# Patient Record
Sex: Female | Born: 1965 | Race: White | Hispanic: No | Smoking: Never smoker
Health system: Southern US, Community
[De-identification: ages and names within clinical notes are randomized; demographics above are authoritative.]

## PROBLEM LIST (undated history)

## (undated) DIAGNOSIS — R7303 Prediabetes: Secondary | ICD-10-CM

## (undated) DIAGNOSIS — M1990 Unspecified osteoarthritis, unspecified site: Secondary | ICD-10-CM

## (undated) DIAGNOSIS — I10 Essential (primary) hypertension: Secondary | ICD-10-CM

## (undated) HISTORY — PX: KNEE ARTHROSCOPY: SUR90

## (undated) HISTORY — PX: TONSILLECTOMY: SUR1361

## (undated) HISTORY — PX: CHOLECYSTECTOMY: SHX55

## (undated) HISTORY — PX: ABDOMINAL HYSTERECTOMY: SHX81

---

## 1998-05-29 ENCOUNTER — Emergency Department (HOSPITAL_COMMUNITY): Admission: EM | Admit: 1998-05-29 | Discharge: 1998-05-29 | Payer: Self-pay | Admitting: Emergency Medicine

## 1998-11-09 ENCOUNTER — Other Ambulatory Visit: Admission: RE | Admit: 1998-11-09 | Discharge: 1998-11-09 | Payer: Self-pay | Admitting: Obstetrics and Gynecology

## 1999-11-23 ENCOUNTER — Other Ambulatory Visit: Admission: RE | Admit: 1999-11-23 | Discharge: 1999-11-23 | Payer: Self-pay | Admitting: Obstetrics and Gynecology

## 1999-11-24 ENCOUNTER — Encounter: Payer: Self-pay | Admitting: Obstetrics and Gynecology

## 1999-11-24 ENCOUNTER — Encounter: Admission: RE | Admit: 1999-11-24 | Discharge: 1999-11-24 | Payer: Self-pay | Admitting: Obstetrics and Gynecology

## 2000-05-18 ENCOUNTER — Other Ambulatory Visit: Admission: RE | Admit: 2000-05-18 | Discharge: 2000-05-18 | Payer: Self-pay | Admitting: Obstetrics and Gynecology

## 2000-07-10 ENCOUNTER — Other Ambulatory Visit: Admission: RE | Admit: 2000-07-10 | Discharge: 2000-07-10 | Payer: Self-pay | Admitting: Obstetrics and Gynecology

## 2000-07-10 ENCOUNTER — Encounter (INDEPENDENT_AMBULATORY_CARE_PROVIDER_SITE_OTHER): Payer: Self-pay | Admitting: Specialist

## 2000-12-03 ENCOUNTER — Other Ambulatory Visit: Admission: RE | Admit: 2000-12-03 | Discharge: 2000-12-03 | Payer: Self-pay | Admitting: Obstetrics and Gynecology

## 2001-02-25 ENCOUNTER — Other Ambulatory Visit: Admission: RE | Admit: 2001-02-25 | Discharge: 2001-02-25 | Payer: Self-pay | Admitting: Obstetrics and Gynecology

## 2001-09-09 ENCOUNTER — Other Ambulatory Visit: Admission: RE | Admit: 2001-09-09 | Discharge: 2001-09-09 | Payer: Self-pay | Admitting: Obstetrics and Gynecology

## 2005-03-17 ENCOUNTER — Emergency Department (HOSPITAL_COMMUNITY): Admission: EM | Admit: 2005-03-17 | Discharge: 2005-03-17 | Payer: Self-pay | Admitting: Family Medicine

## 2006-01-31 ENCOUNTER — Other Ambulatory Visit: Admission: RE | Admit: 2006-01-31 | Discharge: 2006-01-31 | Payer: Self-pay | Admitting: Obstetrics and Gynecology

## 2006-04-07 ENCOUNTER — Emergency Department (HOSPITAL_COMMUNITY): Admission: EM | Admit: 2006-04-07 | Discharge: 2006-04-07 | Payer: Self-pay | Admitting: Family Medicine

## 2006-05-01 ENCOUNTER — Ambulatory Visit (HOSPITAL_COMMUNITY): Admission: RE | Admit: 2006-05-01 | Discharge: 2006-05-01 | Payer: Self-pay | Admitting: Obstetrics and Gynecology

## 2006-05-15 ENCOUNTER — Encounter (INDEPENDENT_AMBULATORY_CARE_PROVIDER_SITE_OTHER): Payer: Self-pay | Admitting: Specialist

## 2006-05-16 ENCOUNTER — Encounter (INDEPENDENT_AMBULATORY_CARE_PROVIDER_SITE_OTHER): Payer: Self-pay | Admitting: Specialist

## 2006-05-16 ENCOUNTER — Inpatient Hospital Stay (HOSPITAL_COMMUNITY): Admission: RE | Admit: 2006-05-16 | Discharge: 2006-05-18 | Payer: Self-pay | Admitting: Obstetrics and Gynecology

## 2010-11-24 ENCOUNTER — Emergency Department (HOSPITAL_COMMUNITY)
Admission: EM | Admit: 2010-11-24 | Discharge: 2010-11-24 | Payer: Self-pay | Source: Home / Self Care | Admitting: Emergency Medicine

## 2011-03-06 ENCOUNTER — Emergency Department (HOSPITAL_COMMUNITY)
Admission: EM | Admit: 2011-03-06 | Discharge: 2011-03-06 | Disposition: A | Discharge status: Home / Self Care | Payer: Managed Care, Other (non HMO) | Attending: Emergency Medicine | Admitting: Emergency Medicine

## 2011-03-06 DIAGNOSIS — L2989 Other pruritus: Secondary | ICD-10-CM | POA: Insufficient documentation

## 2011-03-06 DIAGNOSIS — L298 Other pruritus: Secondary | ICD-10-CM | POA: Insufficient documentation

## 2011-03-06 DIAGNOSIS — R21 Rash and other nonspecific skin eruption: Secondary | ICD-10-CM | POA: Insufficient documentation

## 2011-03-06 DIAGNOSIS — I10 Essential (primary) hypertension: Secondary | ICD-10-CM | POA: Insufficient documentation

## 2011-03-06 DIAGNOSIS — Z79899 Other long term (current) drug therapy: Secondary | ICD-10-CM | POA: Insufficient documentation

## 2011-05-12 NOTE — Discharge Summary (Signed)
NAMETABATHIA, KNOCHE                  ACCOUNT NO.:  [id number]   MEDICAL RECORD NO.:  [id number]          PATIENT TYPE:  INP   LOCATION:  9311                          FACILITY:  [facility]   PHYSICIAN:  Carrington Clamp, M.D. DATE OF BIRTH:  November 17, 1966   DATE OF ADMISSION:  05/16/2006  DATE OF DISCHARGE:  05/18/2006                                 DISCHARGE SUMMARY   ADMISSION DIAGNOSIS:  Symptomatic fibroid uterus.   DISCHARGE DIAGNOSIS:  Symptomatic fibroid uterus.   PROCEDURE:  Total abdominal hysterectomy.   LABORATORY DATA:  Postoperative hemoglobin and hematocrit of 10.2 and 30.4.   HISTORY OF PRESENT ILLNESS:  Please refer to dictated history and physical  in chart but briefly, this is a 45 year old G1, P1-0-0-1, who complained of  increasing pain and bleeding when stopping birth control pills and desiring  to have definitive therapy. She also is being treated for high blood  pressure and desired to stop birth control pills because of the risks of  heart attack and stroke with increasing age.   HOSPITAL COURSE:  The patient was admitted on May 16, 2006 for the above  named procedures and underwent them without complications. The patient had a  JP drain placed in the subcutaneous portion of the incision because of  thickness of that area. On postoperative day 1, the patient was eating and  ambulating and the JP drain was still putting out a significant amount of  fluid. On postoperative day 2, JP drain only put out 10 cc over 6 hours and  the patient was eating, ambulating, voiding, and tolerating her pain  medications. She was discharged after the JP drain was removed with the  following.   DIET:  Was increased fiber and water.   ACTIVITY:  Walk with assistance. No driving for 4 weeks. No lifting for 6  weeks. No sexual activity for 6 weeks.   WOUND CARE:  The patient may shower but no bath. Keep incision clean and  dry.   DISCHARGE MEDICATIONS:  1.  Percocet 5 mg 1 p.o. q.4  to 6 hours p.r.n. pain.  2.  Over-the-counter Motrin.   FOLLOWUP:  With Dr. Henderson Cloud the following Wednesday for staples out.   SPECIAL INSTRUCTIONS:  Cover JP drain site with gauze or Band-Aid and change  daily. Call if temperature greater than 100.4 or excessive bleeding or  drainage from incision.      Carrington Clamp, M.D.  Electronically Signed     MH/MEDQ  D:  06/28/2006  T:  06/28/2006  Job:  [id number]

## 2011-05-12 NOTE — Op Note (Signed)
Donna [name], [name]                  ACCOUNT NO.:  [id number]   MEDICAL RECORD NO.:  [id number]          PATIENT TYPE:  INP   LOCATION:  9311                          FACILITY:  [facility]   PHYSICIAN:  Carrington Clamp, M.D. DATE OF BIRTH:  05/16/1966   DATE OF PROCEDURE:  05/16/2006  DATE OF DISCHARGE:                                 OPERATIVE REPORT   PREOPERATIVE DIAGNOSIS:  Symptomatic fibroid uterus.   POSTOPERATIVE DIAGNOSIS:  Symptomatic fibroid uterus.   PROCEDURE:  Total abdominal hysterectomy.   SURGEON:  Carrington Clamp, M.D.   ASSISTANT:  Randye Lobo, M.D.   ANESTHESIA:  General.   SPECIMENS:  Uterus and cervix.  Fundal fibroid was amputated and sent for  frozen section and found to be a degenerating fibroid.   ESTIMATED BLOOD LOSS:  200 mL.   IV FLUIDS:  2200 mL.   URINE OUTPUT:  500 mL.   COMPLICATIONS:  None.   FINDINGS:  Nine weeks' size uterus with fibroid on the fundus adhered to the  omentum.  There normal ovaries and tubes bilaterally.  There are no other  abnormalities of the pelvis.  The ureters were palpated bilaterally.   MEDICATIONS:  Ancef and Gelfoam.   COUNTS:  Correct x3.   DRAINS:  J-P drain x1.   TECHNIQUE:  After adequate general anesthesia was achieved, the patient was  prepped, draped in usual sterile fashion in supine position.  A vertical  skin incision was made with scalpel 2 cm below the umbilicus through the  skin.  The subcutaneous layer was then first with Bovie cautery until fascia  could be seen.  Fascia was incised in the superior inferior manner with the  scalpel and then incised in the superior inferior manner with the Mayo  scissors.  The fascia was reflected from the rectus muscle. The rectus  muscles were split in the midline.  A bowel free portion of peritoneum was  entered into with sharp and blunt dissection and the peritoneum was incised  in superior to inferior manner with the Metzenbaum scissors.   The fibroid,  which was adhered to the omentum, was identified, immediately  brought through the incision.  Sharp dissection with the Metzenbaums was  used to remove the omentum off the fibroid.  There were several pieces of  omentum then that had essentially buttonholes in them.  These pieces were  then removed with Kelly's, excised with the Bovie cautery and then tied with  a free stitch of 2-0 Vicryl.  The omentum was found to be hemostatic and  omental pieces were taken off the field.  Attention was then turned to the  abdomen which was packed away with five wet laps and O'Connor-O'Sullivan  retractor.  The uterus was grasped bilaterally with a pair of Kelly clamps.  Each of the round ligaments were secured with a suture transfixion stitch of  0 Vicryl and then divided with Bovie cautery.  The Bovie cautery was then  used to incise the posterior leaf of the peritoneum and the anterior leaf of  the broad ligament along the line  of the reflection of the vesicouterine  fascia.  The bladder flap was then created with sharp and blunt dissection  and posterior leaves of the broad ligaments were entered into with the Bovie  cautery and Heaney clamps were placed over the uterine ovarian ligaments.  Each pedicle was secured with a freehand of 0 Vicryl followed by a stitch of  0 Vicryl.  The uterine arteries were skeletonized and then secured with  bilateral Heaney clamps at the level of the internal os.  Each pedicle was  incised Mayo scissors and secured with a stitch of 0 Vicryl.   The cardinal ligament was carefully dissected with alternating successive  bites of the straight Heaney clamp.  Each pedicle was incised with the  scalpel and secured with a stitch of 0 Vicryl.  Continued dissection of the  bladder flap off the cervix was performed and the uterus was amputated at  this point off the cervix because cervix was so long and the pelvis was  deep.  Long instruments were then used to continue the  dissection until it  was noted that the vagina had been entered into.  The Jorgenson clamps then  used to amputate the cervical specimen.  Kochers were used to secure the  vagina and two small portions of the cervix had been left in the vagina were  then excised with the Jorgenson scissors.  Angles of the cuff were then  secured with a figure-of-eight stitch of 0 Vicryl.  The cuff was closed with  a single figure-of-eight stitch of 0 Vicryl.  Irrigation was performed and  the pedicles were found to be hemostatic.  There is a small amount of oozing  of the bladder flap which was care of with the Bovie cautery and a piece of  Gelfoam.  Once it was indicated that hemostasis was achieved, all  instruments then were withdrawn from the abdomen.  The ureters were unable  to be seen but they were palpated well out of the field of dissection  bilaterally by both myself and Dr. Edward Jolly.  All instruments then withdrawn  from the abdomen and the peritoneum is closed with running stitch of 2-0  Vicryl.  The stitch incorporated as a second layer the rectus muscles.  The  fascia was closed with running stitch of 0 looped PDS.  The subcutaneous  tissue was rendered hemostatic with the Bovie cautery.  A JP drain was  placed in the subcutaneous tissue and pulled through the left-hand side of  the patient's abdomen and through a separate incision.  The subcutaneous  tissue was then closed with interrupted stitches of 2-0 plain gut.  The skin  was closed with staples.  The patient tolerated the procedure well and  returned to recovery in stable condition.      Carrington Clamp, M.D.  Electronically Signed     MH/MEDQ  D:  05/16/2006  T:  05/17/2006  Job:  [id number]

## 2011-05-12 NOTE — H&P (Signed)
NAMECHANTEE, CERINO                  ACCOUNT NO.:  [id number]   MEDICAL RECORD NO.:  [id number]          PATIENT TYPE:  INP   LOCATION:  NA                            FACILITY:  [facility]   PHYSICIAN:  Carrington Clamp, M.D. DATE OF BIRTH:  1966/02/13   DATE OF ADMISSION:  05/16/2006  DATE OF DISCHARGE:                                HISTORY & PHYSICAL   This is a 45 year old G1, P1-0-0-1 who complained of increased pain and  bleeding when stopping the birth control pills.  She had been on Coriva but  stopped it secondary to the fact that she is being treated for high blood  pressure.  The patient was seen and evaluated and found to have what  appeared to be about 7-week size uterus and was changed to Yaz continuous.  Patient came back in and discussed with me that she desired to have  definitive therapy as she had high blood pressure and was on  hydrochlorothiazide.  The patient had an ultrasound on April 10, 2006, that  showed a 3.61 anterior fibroid that appeared to be smaller than the one that  had been recorded in 2000.  The uterus was measured 8 x 3 x 4.  Left ovary  appeared normal.  Right ovary was not seen.  The endometrial thickness was  0.4 cm.  I discussed with the patient the risks, benefits, and alternatives  of different therapies including NovaSure endometrial ablation with D&C,  hysteroscopy and tubal ligation to ensure that patient could not get  pregnant in the future.  The patient chose that option and was taken to  surgery on May 01, 2006.   On May 01, 2006, patient was taken for a diagnostic laparoscopy and was  suppose to be followed by tubal ligation and a NovaSure ablation.  However,  upon entry into the abdominal cavity it was clear that there was an enlarged  exophytic fundal fibroid that measured at least 8 cm if not 10 to 12 cm in  diameter.  The omentum was adherent to this fibroid and appeared to be  providing it with blood supply.  During the operation, it was  decided that  it would be unlikely that the patient would receive much in the way of  relief from her symptoms with an endometrial ablation and tubal ligation.  The procedure was abandoned.  I discussed with the patient after she  awakened, the reason for abandoning the procedure.  I discussed with her  that I felt strongly that a total abdominal hysterectomy would be the  operation of choice especially since the fibroid seemed to have grown in  size and was now getting parasitic blood supply from the omentum.  I  discussed with the patient that it was unlikely that this was cancer but  that given its appearance, it would be better that we would take it out and  see.  I had also discussed with the patient that her symptoms would be  definitively taken care of with the hysterectomy.  Patient agreed with this  plan of action and is  to undergo a total abdominal hysterectomy with  possible bilateral salpingo-oophorectomy only if the ovaries looked  abnormal.  The patient reported a history of superficial separation of  cesarean section scar so there is consideration of perhaps doing a vertical  incision.   MEDICATIONS:  Coriva, multivitamins, Benzopril/hydrochlorothiazide 10  mg/12.5 mg, Benadryl and Extra Strength Tylenol.   ALLERGIES:  LARGE DOSES OF AMOXICILLIN which causes GI upset.  The same  happens with HIGH DOSES OF CODEINE.  She reports no specific allergy to  penicillin.   PAST MEDICAL HISTORY:  Significant only for high blood pressure.   PAST SURGICAL HISTORY:  1.  Right knee operation in 1987.  2.  Left knee operation in 1993.  3.  Gallbladder in 1993.  4.  Cesarean section May 1997.   TOBACCO:  None.   Ultrasound is as described above.  Surgical findings on laparoscopy were as  described above as well.   PHYSICAL EXAMINATION:  GENERAL APPEARANCE:  The patient is well-appearing  but obese.  SKIN:  Without lesions.  NECK:  Without thyromegaly.  LUNGS:  Clear to  auscultation bilaterally.  CARDIOVASCULAR:  Regular rate and rhythm.  ABDOMEN:  Soft and nontender, obese.  BREAST:  Examination was benign.  PELVIC:  Normal external genitalia, urethra and vaginal vault and vagina.  Cervix was round and mobile.  The uterus on palpation only felt to be about  7-week size but including the fibroid was over 12-week size.  There were no  abnormalities.  The adnexa, bladder and anus were without masses.   ASSESSMENT:  This is a 45 year old G1, P1 with high blood pressure desiring  to have definitive therapy of her symptomatic fibroid uterus especially  since she is hypertensive and would be best off with birth control pills.  The fibroid has increased in size since 2000 and appears to be parasitic to  the omentum as well as coming off the fundus.  I discussed with the patient  all risks, benefits, and alternatives of the procedure and desires  definitive therapy.  She is to undergo total abdominal hysterectomy with  possible bilateral salpingo-oophorectomy only if the ovaries look abnormal.  The patient is to receive preoperative antibiotics and SCDs in surgery.  The  patient understands all of the risks, benefits, and alternatives and wishes  to proceed.      Carrington Clamp, M.D.  Electronically Signed     MH/MEDQ  D:  05/16/2006  T:  05/16/2006  Job:  [id number]

## 2011-05-12 NOTE — Op Note (Signed)
Donna [name], Donna [name]                  ACCOUNT NO.:  [id number]   MEDICAL RECORD NO.:  [id number]          PATIENT TYPE:  AMB   LOCATION:  SDC                           FACILITY:  [facility]   PHYSICIAN:  Carrington Clamp, M.D. DATE OF BIRTH:  01-Aug-1966   DATE OF PROCEDURE:  05/01/2006  DATE OF DISCHARGE:                                 OPERATIVE REPORT   PREOPERATIVE DIAGNOSIS:  Menorrhagia multiparous, desires permit sterility.   POSTOPERATIVE DIAGNOSIS:  Large fundal fibroid.   PROCEDURE:  Diagnostic laparoscopy.   SURGEON:  Dr. Henderson Cloud.   ASSISTANT:  None.   ANESTHESIA:  General.   SPECIMENS:  None.   ESTIMATED BLOOD LOSS:  Minimal.   IV FLUIDS:  see anesthesia record   URINE OUTPUT:  Not measured.   COMPLICATIONS:  None.   FINDINGS:  8 cm fundal fibroid which was adherent and parasitic to the  omentum.  Because of the thickness of the omentum, it was difficult to see  if bowel was adhered in this mass as well.  The base of the fibroid was  almost as wide as the fundus of the top of the uterus and it was difficult  to see if there was further fibroids in the uterus.  Right and left ovaries  were seen and appeared normal.  There was no apparent endometriosis.  Unfortunately with a single puncture scope it was unable to be determined if  there was any areas of endometriosis on all the surfaces.  The appendix was  not seen.  The liver edge was normal.   The patient was prepped, draped usual sterile fashion in dorsal lithotomy  position after adequate anesthesia was achieved.  The bladder was emptied  with a rubber catheter during the case and a Kahn cannula was placed in the  vagina and through the cervical os.  The attention was turned the abdomen  where a 2 cm infraumbilical incision was made with the scalpel.  The OptiVu  scope was passed into this and was attempted to be passed into the abdomen.  Unfortunately enough purchase was unable to be obtained even with a towel  clip at the incision site in order to allow the OptiVu to be able to be  entered into all the way through to the peritoneal cavity.  The Veress  needle was then passed into the incision and through the fascia.  The  abdomen was insufflated after checking for after no bowel contents or blood  were aspirated.  The 12 mm regular trocar was then passed without  complication.   The above findings were noted and it was then decided to abandon the  procedure because of the likelihood that the fibroid would be continuing to  cause additional pain and cramping with periods as well as the possibility  of it causing failure of the Novasure.  Additionally, because the fibroid  was so adherent to the omentum, it was felt that this is a fibroid that  should be removed and examined pathologically.  The fibroid did not appear  to be that size on the transvaginal  ultrasound.  However, it is possible  that was not seen entirely on the transvaginal ultrasound because of the  fact that it was so fundal.  However, there is also consideration it was the  same fibroid that was seen on transvaginal ultrasound that has since then  grown even larger.  Although this is unlikely I will counsel the patient  that I felt strongly that a D&C hysteroscopy and Novasure ablation with  tubal ligation was not going to be optimal for her symptoms and the size of  fibroid indicates that we probably should remove it pathologically and  counseled her that my strong opinion is that we proceed with a hysterectomy  instead of the intermediate procedures.  I will discuss this with the  patient when she wakes and discuss with the family as well.   After at the above findings were noted, all instruments were then withdrawn  from the abdomen, the abdomen desufflated.  The 10 meters trocar site fascia  was then closed with a figure-of-eight stitch of 2-0 Vicryl.  The skin was  closed with staples.  There was marked tissue bruising just  underneath the  incision where Allis clamps and towel clips were used to aid in the  administration of the trocar.  I will discuss this with the patient as well  that this will go away.  All instruments were then withdrawn from the  vagina.  The patient tolerated the procedure well.  She was returned  recovery stable condition.      Carrington Clamp, M.D.  Electronically Signed     MH/MEDQ  D:  05/01/2006  T:  05/02/2006  Job:  [id number]

## 2012-03-17 ENCOUNTER — Emergency Department (HOSPITAL_COMMUNITY): Payer: Managed Care, Other (non HMO)

## 2012-03-17 ENCOUNTER — Encounter (HOSPITAL_COMMUNITY): Payer: Self-pay

## 2012-03-17 ENCOUNTER — Emergency Department (HOSPITAL_COMMUNITY)
Admission: EM | Admit: 2012-03-17 | Discharge: 2012-03-17 | Disposition: A | Discharge status: Home / Self Care | Payer: Managed Care, Other (non HMO) | Attending: Emergency Medicine | Admitting: Emergency Medicine

## 2012-03-17 DIAGNOSIS — J111 Influenza due to unidentified influenza virus with other respiratory manifestations: Secondary | ICD-10-CM | POA: Insufficient documentation

## 2012-03-17 DIAGNOSIS — J3489 Other specified disorders of nose and nasal sinuses: Secondary | ICD-10-CM | POA: Insufficient documentation

## 2012-03-17 DIAGNOSIS — R6889 Other general symptoms and signs: Secondary | ICD-10-CM

## 2012-03-17 DIAGNOSIS — I10 Essential (primary) hypertension: Secondary | ICD-10-CM | POA: Insufficient documentation

## 2012-03-17 HISTORY — DX: Essential (primary) hypertension: I10

## 2012-03-17 MED ORDER — ALBUTEROL SULFATE HFA 108 (90 BASE) MCG/ACT IN AERS
2.0000 | INHALATION_SPRAY | RESPIRATORY_TRACT | Status: DC | PRN
  Administered 2012-03-17: 2 via RESPIRATORY_TRACT
  Filled 2012-03-17: qty 6.7

## 2012-03-17 MED ORDER — BENZONATATE 100 MG PO CAPS: 100.0000 mg | ORAL_CAPSULE | Freq: Three times a day (TID) | ORAL | Status: AC

## 2012-03-17 MED ORDER — TRAMADOL HCL 50 MG PO TABS: 50.0000 mg | ORAL_TABLET | Freq: Four times a day (QID) | ORAL | Status: AC | PRN

## 2012-03-17 MED ORDER — ONDANSETRON HCL 4 MG PO TABS: 4.0000 mg | ORAL_TABLET | Freq: Four times a day (QID) | ORAL | Status: AC

## 2012-03-17 NOTE — Discharge Instructions (Signed)
Cough, Adult  A cough is a reflex that helps clear your throat and airways. It can help heal the body or may be a reaction to an irritated airway. A cough may only last 2 or 3 weeks (acute) or may last more than 8 weeks (chronic).  CAUSES Acute cough:  Viral or bacterial infections.  Chronic cough:  Infections.   Allergies.   Asthma.   Post-nasal drip.   Smoking.   Heartburn or acid reflux.   Some medicines.   Chronic lung problems (COPD).   Cancer.  SYMPTOMS   Cough.   Fever.   Chest pain.   Increased breathing rate.   High-pitched whistling sound when breathing (wheezing).   Colored mucus that you cough up (sputum).  TREATMENT   A bacterial cough may be treated with antibiotic medicine.   A viral cough must run its course and will not respond to antibiotics.   Your caregiver may recommend other treatments if you have a chronic cough.  HOME CARE INSTRUCTIONS   Only take over-the-counter or prescription medicines for pain, discomfort, or fever as directed by your caregiver. Use cough suppressants only as directed by your caregiver.   Use a cold steam vaporizer or humidifier in your bedroom or home to help loosen secretions.   Sleep in a semi-upright position if your cough is worse at night.   Rest as needed.   Stop smoking if you smoke.  SEEK IMMEDIATE MEDICAL CARE IF:   You have pus in your sputum.   Your cough starts to worsen.   You cannot control your cough with suppressants and are losing sleep.   You begin coughing up blood.   You have difficulty breathing.   You develop pain which is getting worse or is uncontrolled with medicine.   You have a fever.  MAKE SURE YOU:   Understand these instructions.   Will watch your condition.   Will get help right away if you are not doing well or get worse.  Document Released: 06/09/2011 Document Revised: 11/30/2011 Document Reviewed: 06/09/2011 [employer] Patient Information 2012 [place],  [place].Cool Mist Vaporizers Vaporizers may help relieve the symptoms of a cough and cold. By adding water to the air, mucus may become thinner and less sticky. This makes it easier to breathe and cough up secretions. Vaporizers have not been proven to show they help with colds. You should not use a vaporizer if you are allergic to mold. Cool mist vaporizers do not cause serious burns like hot mist vaporizers ("steamers"). HOME CARE INSTRUCTIONS  Follow the package instructions for your vaporizer.   Use a vaporizer that holds a large volume of water (1 to 2 gallons [5.7 to 7.5 liters]).   Do not use anything other than distilled water in the vaporizer.   Do not run the vaporizer all of the time. This can cause mold or bacteria to grow in the vaporizer.   Clean the vaporizer after each time you use it.   Clean and dry the vaporizer well before you store it.   Stop using a vaporizer if you develop worsening respiratory symptoms.  Document Released: 09/07/2004 Document Revised: 11/30/2011 Document Reviewed: 08/05/2009 [facility] Patient Information 2012 [place], [place].Influenza Facts Flu (influenza) is a contagious respiratory illness caused by the influenza viruses. It can cause mild to severe illness. While most healthy people recover from the flu without specific treatment and without complications, older people, young children, and people with certain health conditions are at higher risk for serious complications  from the flu, including death. CAUSES   The flu virus is spread from person to person by respiratory droplets from coughing and sneezing.   A person can also become infected by touching an object or surface with a virus on it and then touching their mouth, eye or nose.   Adults may be able to infect others from 1 day before symptoms occur and up to 7 days after getting sick. So it is possible to give someone the flu even before you know you are sick and continue to infect others while  you are sick.  SYMPTOMS   Fever (usually high).   Headache.   Tiredness (can be extreme).   Cough.   Sore throat.   Runny or stuffy nose.   Body aches.   Diarrhea and vomiting may also occur, particularly in children.   These symptoms are referred to as "flu-like symptoms". A lot of different illnesses, including the common cold, can have similar symptoms.  DIAGNOSIS   There are tests that can determine if you have the flu as long you are tested within the first 2 or 3 days of illness.   A doctor's exam and additional tests may be needed to identify if you have a disease that is a complicating the flu.  RISKS AND COMPLICATIONS  Some of the complications caused by the flu include:  Bacterial pneumonia or progressive pneumonia caused by the flu virus.   Loss of body fluids (dehydration).   Worsening of chronic medical conditions, such as heart failure, asthma, or diabetes.   Sinus problems and ear infections.  HOME CARE INSTRUCTIONS   Seek medical care early on.   If you are at high risk from complications of the flu, consult your health-care provider as soon as you develop flu-like symptoms. Those at high risk for complications include:   People 65 years or older.   People with chronic medical conditions, including diabetes.   Pregnant women.   Young children.   Your caregiver may recommend use of an antiviral medication to help treat the flu.   If you get the flu, get plenty of rest, drink a lot of liquids, and avoid using alcohol and tobacco.   You can take over-the-counter medications to relieve the symptoms of the flu if your caregiver approves. (Never give aspirin to children or teenagers who have flu-like symptoms, particularly fever).  PREVENTION  The single best way to prevent the flu is to get a flu vaccine each fall. Other measures that can help protect against the flu are:  Antiviral Medications   A number of antiviral drugs are approved for use in  preventing the flu. These are prescription medications, and a doctor should be consulted before they are used.   Habits for Good Health   Cover your nose and mouth with a tissue when you cough or sneeze, throw the tissue away after you use it.   Wash your hands often with soap and water, especially after you cough or sneeze. If you are not near water, use an alcohol-based hand cleaner.   Avoid people who are sick.   If you get the flu, stay home from work or school. Avoid contact with other people so that you do not make them sick, too.   Try not to touch your eyes, nose, or mouth as germs ore often spread this way.  IN CHILDREN, EMERGENCY WARNING SIGNS THAT NEED URGENT MEDICAL ATTENTION:  Fast breathing or trouble breathing.   Bluish skin color.  Not drinking enough fluids.   Not waking up or not interacting.   Being so irritable that the child does not want to be held.   Flu-like symptoms improve but then return with fever and worse cough.   Fever with a rash.  IN ADULTS, EMERGENCY WARNING SIGNS THAT NEED URGENT MEDICAL ATTENTION:  Difficulty breathing or shortness of breath.   Pain or pressure in the chest or abdomen.   Sudden dizziness.   Confusion.   Severe or persistent vomiting.  SEEK IMMEDIATE MEDICAL CARE IF:  You or someone you know is experiencing any of the symptoms above. When you arrive at the emergency center,report that you think you have the flu. You may be asked to wear a mask and/or sit in a secluded area to protect others from getting sick. MAKE SURE YOU:   Understand these instructions.   Monitor your condition.   Seek medical care if you are getting worse, or not improving.  Document Released: 12/14/2003 Document Revised: 11/30/2011 Document Reviewed: 09/09/2009 [employer] Patient Information 2012 [place], [place].

## 2012-03-17 NOTE — ED Notes (Signed)
MD at bedside. Tiffany ED PA

## 2012-03-17 NOTE — ED Provider Notes (Signed)
Medical screening examination/treatment/procedure(s) were performed by non-physician practitioner and as supervising physician I was immediately available for consultation/collaboration.  Ethelda Chick, MD 03/17/12 [id number]

## 2012-03-17 NOTE — ED Notes (Signed)
Pt reports 1 week ago began with a dry cough, now body aches and head congestion, all mucus clear, reports fever last weekend, had emesis Friday from noon till 10 pm, no diarrhea

## 2012-03-17 NOTE — ED Notes (Signed)
Pt c/o aches, chills, congestion, and non-productive cough x1 week, reports nausea and vomiting starting Friday at noon lasting through Friday night and unable to keep down fluids.

## 2012-03-17 NOTE — ED Provider Notes (Signed)
History     CSN: [id number]  Arrival date & time 03/17/12  0945   First MD Initiated Contact with Patient 03/17/12 1027      Chief Complaint  Patient presents with  . URI    (Consider location/radiation/quality/duration/timing/severity/associated sxs/prior treatment) HPI  Patient presents to the ED with complaints of cough, nasal congestion and body aches for 1 week. She states that 12 people at her job currently have the same illness. She has tried taking Mucinex but it has not worked. Pt had a few episodes of emesis on Friday and no diarrhea. The patient states that she still has her energy but she just does not feel well. She denies stomach pains, CP, SOB, weakness, sore throat, ear pain. The patient is otherwise healthy.  Past Medical History  Diagnosis Date  . Hypertension     Past Surgical History  Procedure Date  . Cholecystectomy   . Tonsillectomy   . Abdominal hysterectomy     partial  . Cesarean section   . Knee arthroscopy     History reviewed. No pertinent family history.  History  Substance Use Topics  . Smoking status: Never Smoker   . Smokeless tobacco: Not on file  . Alcohol Use: No    OB History    Grav Para Term Preterm Abortions TAB SAB Ect Mult Living                  Review of Systems  All other systems reviewed and are negative.    Allergies  Amoxicillin and Codeine  Home Medications   Current Outpatient Rx  Name Route Sig Dispense Refill  . EXCEDRIN PO Oral Take 2 tablets by mouth every 4 (four) hours as needed. For headache    . GUAIFENESIN ER 600 MG PO TB12 Oral Take 600 mg by mouth 2 (two) times daily.    Marland Kitchen BENZONATATE 100 MG PO CAPS Oral Take 1 capsule (100 mg total) by mouth every 8 (eight) hours. 21 capsule 0  . ONDANSETRON HCL 4 MG PO TABS Oral Take 1 tablet (4 mg total) by mouth every 6 (six) hours. 12 tablet 0  . TRAMADOL HCL 50 MG PO TABS Oral Take 1 tablet (50 mg total) by mouth every 6 (six) hours as needed for pain.  15 tablet 0    BP 122/85  Pulse 86  Temp(Src) 98.3 F (36.8 C) (Oral)  Resp 18  SpO2 98%  Physical Exam  Nursing note and vitals reviewed. Constitutional: She appears well-developed and well-nourished. No distress.  HENT:  Head: Normocephalic and atraumatic.  Right Ear: External ear normal.  Left Ear: External ear normal.  Nose: Nose normal.  Mouth/Throat: Oropharynx is clear and moist.  Eyes: Pupils are equal, round, and reactive to light.  Neck: Normal range of motion. Neck supple.  Cardiovascular: Normal rate and regular rhythm.   Pulmonary/Chest: Effort normal and breath sounds normal. No respiratory distress. She has no wheezes (pt coughing during exam). She has no rales.  Abdominal: Soft. Bowel sounds are normal. There is no tenderness. There is no rebound and no guarding.  Musculoskeletal: Normal range of motion.  Neurological: She is alert.  Skin: Skin is warm and dry.    ED Course  Procedures (including critical care time)  Labs Reviewed - No data to display Dg Chest 2 View  03/17/2012  *RADIOLOGY REPORT*  Clinical Data: 1-week history of cough.  History of hypertension.  CHEST - 2 VIEW 03/17/2012:  Comparison: None.  Findings:  Cardiomediastinal silhouette unremarkable.  Lungs clear. Bronchovascular markings normal.  Pulmonary vascularity normal.  No pneumothorax.  No pleural effusions.  Degenerative changes and DISH involving the thoracic spine.  IMPRESSION: No acute cardiopulmonary disease.  Original Report Authenticated By: Arnell Sieving, M.D.     1. Flu-like symptoms       MDM   Pt xray normal. Pt afibrile and saturating oxygen well. Pt given Albuterol inhaler in ED. Also given Rx for ultram, Zofran and Tessalon perls. Pt given return to ED precautions, a note for Monday off from work.          Dorthula Matas, PA 03/17/12 1123

## 2012-04-14 ENCOUNTER — Emergency Department (HOSPITAL_COMMUNITY)
Admission: EM | Admit: 2012-04-14 | Discharge: 2012-04-15 | Disposition: A | Discharge status: Home / Self Care | Payer: Managed Care, Other (non HMO) | Attending: Emergency Medicine | Admitting: Emergency Medicine

## 2012-04-14 ENCOUNTER — Encounter (HOSPITAL_COMMUNITY): Payer: Self-pay | Admitting: Emergency Medicine

## 2012-04-14 ENCOUNTER — Emergency Department (HOSPITAL_COMMUNITY): Payer: Managed Care, Other (non HMO)

## 2012-04-14 DIAGNOSIS — I10 Essential (primary) hypertension: Secondary | ICD-10-CM | POA: Insufficient documentation

## 2012-04-14 DIAGNOSIS — S8990XA Unspecified injury of unspecified lower leg, initial encounter: Secondary | ICD-10-CM | POA: Insufficient documentation

## 2012-04-14 DIAGNOSIS — M25569 Pain in unspecified knee: Secondary | ICD-10-CM | POA: Insufficient documentation

## 2012-04-14 DIAGNOSIS — W010XXA Fall on same level from slipping, tripping and stumbling without subsequent striking against object, initial encounter: Secondary | ICD-10-CM | POA: Insufficient documentation

## 2012-04-14 DIAGNOSIS — X500XXA Overexertion from strenuous movement or load, initial encounter: Secondary | ICD-10-CM | POA: Insufficient documentation

## 2012-04-14 DIAGNOSIS — S99929A Unspecified injury of unspecified foot, initial encounter: Secondary | ICD-10-CM | POA: Insufficient documentation

## 2012-04-14 MED ORDER — HYDROCODONE-ACETAMINOPHEN 5-325 MG PO TABS: 2.0000 | ORAL_TABLET | Freq: Four times a day (QID) | ORAL | Status: AC | PRN

## 2012-04-14 NOTE — ED Provider Notes (Signed)
History     CSN: [id number]  Arrival date & time 04/14/12  2203   None     Chief Complaint  Patient presents with  . Knee Pain    (Consider location/radiation/quality/duration/timing/severity/associated sxs/prior treatment) HPI Comments: Patient states that she slipped coming out of her back door.  This morning, twisting her left knee.  She has been wearing a supportive brace, as well as elevating she's taken Naprosyn x2.  Since then, but is concerned, as she's had previous arthroscopic surgery on that knee by Dr. Thurston Hole  Patient is a 46 y.o. female presenting with knee pain. The history is provided by the patient.  Knee Pain This is a new problem. The current episode started today. The problem occurs constantly. The problem has been gradually worsening. Pertinent negatives include no numbness or weakness.    Past Medical History  Diagnosis Date  . Hypertension     Past Surgical History  Procedure Date  . Cholecystectomy   . Tonsillectomy   . Abdominal hysterectomy     partial  . Cesarean section   . Knee arthroscopy     No family history on file.  History  Substance Use Topics  . Smoking status: Never Smoker   . Smokeless tobacco: Not on file  . Alcohol Use: No    OB History    Grav Para Term Preterm Abortions TAB SAB Ect Mult Living                  Review of Systems  Skin: Negative for wound.  Neurological: Negative for weakness and numbness.    Allergies  Amoxicillin and Codeine  Home Medications   Current Outpatient Rx  Name Route Sig Dispense Refill  . EXCEDRIN PO Oral Take 2 tablets by mouth every 4 (four) hours as needed. For headache    . GUAIFENESIN ER 600 MG PO TB12 Oral Take 600 mg by mouth 2 (two) times daily.    Marland Kitchen NAPROXEN SODIUM 220 MG PO TABS Oral Take 220 mg by mouth 2 (two) times daily with a meal. For pain    . HYDROCODONE-ACETAMINOPHEN 5-325 MG PO TABS Oral Take 2 tablets by mouth every 6 (six) hours as needed for pain. 30 tablet  0    BP 124/73  Pulse 78  Temp(Src) 98.5 F (36.9 C) (Oral)  Resp 18  SpO2 98%  Physical Exam  Constitutional: She is oriented to person, place, and time. She appears well-developed and well-nourished.  HENT:  Head: Normocephalic.  Eyes: Pupils are equal, round, and reactive to light.  Neck: Normal range of motion.  Pulmonary/Chest: Effort normal.  Musculoskeletal: She exhibits tenderness. She exhibits no edema.       Left knee: She exhibits decreased range of motion. She exhibits no swelling, no ecchymosis, no deformity, no laceration, no erythema, normal alignment, no LCL laxity, normal patellar mobility, no bony tenderness, normal meniscus and no MCL laxity. tenderness found. Medial joint line and lateral joint line tenderness noted. No patellar tendon tenderness noted.  Neurological: She is alert and oriented to person, place, and time.  Skin: Skin is warm and dry. No pallor.    ED Course  Procedures (including critical care time)  Labs Reviewed - No data to display Dg Knee Complete 4 Views Left  04/14/2012  *RADIOLOGY REPORT*  Clinical Data: Knee pain after fall down steps and twisting injury.  LEFT KNEE - COMPLETE 4+ VIEW  Comparison: None.  Findings: Moderate sized left knee effusion.  Tricompartmental degenerative  changes with prominent hypertrophic changes particularly in the medial and patellofemoral compartments.  Small osseous fragment adjacent to the medial tibial spine suggests a loose body or prominent hypertroph.  This is well corticated consistent with chronic process.  No acute fracture is identified. The bone cortex and trabecular pattern appear intact.  No focal bone lesion or bone destruction.  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  Moderate left knee effusion.  No displaced fractures identified.  Original Report Authenticated By: Marlon Pel, M.D.     1. Knee injury       MDM  Patient has no deformity.  Slight decreased range of  motion.  No edema or swelling.  No laxity of tendons.  No patella mobility.  This is most likely a muscle strain, but will immobilize the joint provide anti-inflammatory, as well as pain control pill outpatient to follow up with Dr. Thurston Hole for further evaluation.         Arman Filter, NP 04/14/12 2346  Arman Filter, NP 04/14/12 2346  Arman Filter, NP 04/14/12 2346

## 2012-04-14 NOTE — Discharge Instructions (Signed)
Knee Effusion The medical term for having fluid in your knee is effusion. This is often due to an internal derangement of the knee. This means something is wrong inside the knee. Some of the causes of fluid in the knee may be torn cartilage, a torn ligament, or bleeding into the joint from an injury. Your knee is likely more difficult to bend and move. This is often because there is increased pain and pressure in the joint. The time it takes for recovery from a knee effusion depends on different factors, including:   Type of injury.   Your age.   Physical and medical conditions.   Rehabilitation Strategies.  How long you will be away from your normal activities will depend on what kind of knee problem you have and how much damage is present. Your knee has two types of cartilage. Articular cartilage covers the bone ends and lets your knee bend and move smoothly. Two menisci, thick pads of cartilage that form a rim inside the joint, help absorb shock and stabilize your knee. Ligaments bind the bones together and support your knee joint. Muscles move the joint, help support your knee, and take stress off the joint itself. CAUSES  Often an effusion in the knee is caused by an injury to one of the menisci. This is often a tear in the cartilage. Recovery after a meniscus injury depends on how much meniscus is damaged and whether you have damaged other knee tissue. Small tears may heal on their own with conservative treatment. Conservative means rest, limited weight bearing activity and muscle strengthening exercises. Your recovery may take up to 6 weeks.  TREATMENT  Larger tears may require surgery. Meniscus injuries may be treated during arthroscopy. Arthroscopy is a procedure in which your surgeon uses a small telescope like instrument to look in your knee. Your caregiver can make a more accurate diagnosis (learning what is wrong) by performing an arthroscopic procedure. If your injury is on the inner  margin of the meniscus, your surgeon may trim the meniscus back to a smooth rim. In other cases your surgeon will try to repair a damaged meniscus with stitches (sutures). This may make rehabilitation take longer, but may provide better long term result by helping your knee keep its shock absorption capabilities. Ligaments which are completely torn usually require surgery for repair. HOME CARE INSTRUCTIONS  Use crutches as instructed.   If a brace is applied, use as directed.   Once you are home, an ice pack applied to your swollen knee may help with discomfort and help decrease swelling.   Keep your knee raised (elevated) when you are not up and around or on crutches.   Only take over-the-counter or prescription medicines for pain, discomfort, or fever as directed by your caregiver.   Your caregivers will help with instructions for rehabilitation of your knee. This often includes strengthening exercises.   You may resume a normal diet and activities as directed.  SEEK MEDICAL CARE IF:   There is increased swelling in your knee.   You notice redness, swelling, or increasing pain in your knee.   An unexplained oral temperature above 102 F (38.9 C) develops.  SEEK IMMEDIATE MEDICAL CARE IF:   You develop a rash.   You have difficulty breathing.   You have any allergic reactions from medications you may have been given.   There is severe pain with any motion of the knee.  MAKE SURE YOU:   Understand these instructions.  Will watch your condition.   Will get help right away if you are not doing well or get worse.  Document Released: 03/02/2004 Document Revised: 11/30/2011 Document Reviewed: 05/06/2008 [facility] Patient Information 2012 [place], [place]. Every small knee effusion on x-ray.  The bones are intact.  Please wear the immobilizer while up and walking.  He also been given crutches for ease of movement as well as pain medication.  Please make an appointment with Dr.  Duanne Guess for followup

## 2012-04-14 NOTE — ED Notes (Signed)
C/o L knee pain since twisting knee when she stepped out backdoor today onto steps.

## 2012-04-15 NOTE — ED Provider Notes (Signed)
Medical screening examination/treatment/procedure(s) were performed by non-physician practitioner and as supervising physician I was immediately available for consultation/collaboration.  Gustaf Mccarter Smitty Cords, MD 04/15/12 [place]

## 2012-04-15 NOTE — ED Notes (Signed)
Paged ortho tech 

## 2012-04-15 NOTE — Progress Notes (Signed)
Orthopedic Tech Progress Note Patient Details:  Donna [name] 1966/12/24 [id number]  Other Ortho Devices Type of Ortho Device: Knee Immobilizer Ortho Device Location: (L) LE Ortho Device Interventions: Application   Jennye Moccasin 04/15/2012, 12:35 AM

## 2012-04-15 NOTE — ED Notes (Signed)
Patient is AOx4 and comfortable with her discharge instructions. 

## 2012-10-29 ENCOUNTER — Emergency Department (HOSPITAL_COMMUNITY)
Admission: EM | Admit: 2012-10-29 | Discharge: 2012-10-29 | Disposition: A | Discharge status: Home / Self Care | Payer: Managed Care, Other (non HMO) | Attending: Emergency Medicine | Admitting: Emergency Medicine

## 2012-10-29 ENCOUNTER — Encounter (HOSPITAL_COMMUNITY): Payer: Self-pay | Admitting: *Deleted

## 2012-10-29 DIAGNOSIS — Z791 Long term (current) use of non-steroidal anti-inflammatories (NSAID): Secondary | ICD-10-CM | POA: Insufficient documentation

## 2012-10-29 DIAGNOSIS — L02419 Cutaneous abscess of limb, unspecified: Secondary | ICD-10-CM | POA: Insufficient documentation

## 2012-10-29 DIAGNOSIS — I10 Essential (primary) hypertension: Secondary | ICD-10-CM | POA: Insufficient documentation

## 2012-10-29 DIAGNOSIS — L02415 Cutaneous abscess of right lower limb: Secondary | ICD-10-CM

## 2012-10-29 MED ORDER — SULFAMETHOXAZOLE-TRIMETHOPRIM 800-160 MG PO TABS: 1.0000 | ORAL_TABLET | Freq: Two times a day (BID) | ORAL | Status: DC

## 2012-10-29 NOTE — ED Provider Notes (Addendum)
History     CSN: [id number]  Arrival date & time 10/29/12  1818   First MD Initiated Contact with Patient 10/29/12 2045      Chief Complaint  Patient presents with  . Abscess   HPI  History provided by the patient. Patient is a 46 year old female with history of hypertension who presents with redness and tenderness to right anterior thigh. Patient states symptoms began with a small pimple that she tried to pop several days ago. Since that time she's had increased redness and swelling she has been using peroxide and alcohol over. She is also used a heated needle to poke the area to help it drain but reports only having bleeding from the area. Patient denies having similar symptoms previously. She denies any erythematous streaks. Denies any fever, chills or sweats.    Past Medical History  Diagnosis Date  . Hypertension     Past Surgical History  Procedure Date  . Cholecystectomy   . Tonsillectomy   . Abdominal hysterectomy     partial  . Cesarean section   . Knee arthroscopy     No family history on file.  History  Substance Use Topics  . Smoking status: Never Smoker   . Smokeless tobacco: Not on file  . Alcohol Use: No    OB History    Grav Para Term Preterm Abortions TAB SAB Ect Mult Living                  Review of Systems  Constitutional: Negative for fever and chills.  Skin:       Redness and swelling to right anterior thigh    Allergies  Amoxicillin and Codeine  Home Medications   Current Outpatient Rx  Name  Route  Sig  Dispense  Refill  . EXCEDRIN PO   Oral   Take 2 tablets by mouth every 4 (four) hours as needed. For headache         . NAPROXEN SODIUM 220 MG PO TABS   Oral   Take 440 mg by mouth 2 (two) times daily as needed. For pain           BP 139/77  Pulse 102  Temp 97.6 F (36.4 C) (Oral)  Resp 18  SpO2 98%  Physical Exam  Nursing note and vitals reviewed. Constitutional: She is oriented to person, place, and time. She  appears well-developed and well-nourished. No distress.  HENT:  Head: Normocephalic.  Cardiovascular: Normal rate and regular rhythm.   Pulmonary/Chest: Effort normal and breath sounds normal.  Neurological: She is alert and oriented to person, place, and time.  Skin: Skin is warm and dry.       3.5 cm nodular area of swelling and tenderness to anterior right thigh with surrounding 8 cm of erythema. There is a central dark eschar. No active bleeding or drainage. No erythematous streak  Psychiatric: She has a normal mood and affect. Her behavior is normal.    ED Course  Procedures    INCISION AND DRAINAGE Performed by: Angus Seller Consent: Verbal consent obtained. Risks and benefits: risks, benefits and alternatives were discussed Type: abscess  Body area: Right anterior thigh  Anesthesia: local infiltration  Local anesthetic: lidocaine 2% with epinephrine  Anesthetic total: 6 ml  Complexity: complex Blunt dissection to break up loculations Scalpel used to perform I&D   Drainage: purulent  Drainage amount: Small   Packing material: None   Patient tolerance: Patient tolerated the procedure well with no  immediate complications.      1. Abscess of right thigh       MDM  Patient seen and evaluated. Abscess I&D done without significant drainage. Scalpel used to perform I&D. Mild surrounding erythema. At this time have advised patient to continue warm compresses regularly. Also given prescription for Bactrim with instructions for patient to use if not having improvements in 2 days. Patient also instructed to have a recheck in 2-3 days.        Angus Seller, PA 10/30/12 0551  Angus Seller, PA 11/14/12 [phone]

## 2012-10-29 NOTE — ED Notes (Signed)
Pt has abscess to right upper thigh with redness around site

## 2012-10-31 NOTE — ED Provider Notes (Signed)
Medical screening examination/treatment/procedure(s) were performed by non-physician practitioner and as supervising physician I was immediately available for consultation/collaboration.  Jones Skene, M.D.     Jones Skene, MD 10/31/12 1727

## 2012-11-14 NOTE — ED Provider Notes (Signed)
Medical screening examination/treatment/procedure(s) were performed by non-physician practitioner and as supervising physician I was immediately available for consultation/collaboration.  Jones Skene, M.D.     Jones Skene, MD 11/14/12 [phone]

## 2016-03-28 ENCOUNTER — Encounter (HOSPITAL_COMMUNITY): Payer: Self-pay | Admitting: Emergency Medicine

## 2016-03-28 ENCOUNTER — Emergency Department (HOSPITAL_COMMUNITY)
Admission: EM | Admit: 2016-03-28 | Discharge: 2016-03-29 | Disposition: A | Discharge status: Home / Self Care | Payer: 59 | Attending: Emergency Medicine | Admitting: Emergency Medicine

## 2016-03-28 DIAGNOSIS — M7981 Nontraumatic hematoma of soft tissue: Secondary | ICD-10-CM | POA: Diagnosis not present

## 2016-03-28 DIAGNOSIS — M79642 Pain in left hand: Secondary | ICD-10-CM | POA: Diagnosis present

## 2016-03-28 DIAGNOSIS — I10 Essential (primary) hypertension: Secondary | ICD-10-CM | POA: Insufficient documentation

## 2016-03-28 DIAGNOSIS — I809 Phlebitis and thrombophlebitis of unspecified site: Secondary | ICD-10-CM | POA: Insufficient documentation

## 2016-03-28 DIAGNOSIS — T148XXA Other injury of unspecified body region, initial encounter: Secondary | ICD-10-CM

## 2016-03-28 DIAGNOSIS — Z880 Allergy status to penicillin: Secondary | ICD-10-CM | POA: Insufficient documentation

## 2016-03-28 NOTE — ED Notes (Signed)
Pt. noticed a small bruise at left hand with mild swelling and mild pain onset this morning , denies injury .

## 2016-03-29 ENCOUNTER — Emergency Department (HOSPITAL_COMMUNITY): Payer: 59

## 2016-03-29 NOTE — ED Provider Notes (Signed)
CSN: [id number]     Arrival date & time 03/28/16  2330 History   First MD Initiated Contact with Patient 03/29/16 0410     Chief Complaint  Patient presents with  . Hand Pain      HPI  Patient presents evaluation of an area on the back of her left hand she is uncertain about. She notes some discoloration. Mild tenderness. No fall no injury. No history of easy bruising or bleeding. Not anticoagulated.  Past Medical History  Diagnosis Date  . Hypertension    Past Surgical History  Procedure Laterality Date  . Cholecystectomy    . Tonsillectomy    . Abdominal hysterectomy      partial  . Cesarean section    . Knee arthroscopy     No family history on file. Social History  Substance Use Topics  . Smoking status: Never Smoker   . Smokeless tobacco: None  . Alcohol Use: No   OB History    No data available     Review of Systems  Constitutional: Negative for fever, chills, diaphoresis, appetite change and fatigue.  HENT: Negative for mouth sores, sore throat and trouble swallowing.   Eyes: Negative for visual disturbance.  Respiratory: Negative for cough, chest tightness, shortness of breath and wheezing.   Cardiovascular: Negative for chest pain.  Gastrointestinal: Negative for nausea, vomiting, abdominal pain, diarrhea and abdominal distention.  Endocrine: Negative for polydipsia, polyphagia and polyuria.  Genitourinary: Negative for dysuria, frequency and hematuria.  Musculoskeletal: Negative for gait problem.  Skin: Positive for color change. Negative for pallor and rash.  Neurological: Negative for dizziness, syncope, light-headedness and headaches.  Hematological: Does not bruise/bleed easily.  Psychiatric/Behavioral: Negative for behavioral problems and confusion.      Allergies  Amoxicillin and Codeine  Home Medications   Prior to Admission medications   Medication Sig Start Date End Date Taking? Authorizing Provider  Aspirin-Acetaminophen-Caffeine  (EXCEDRIN PO) Take 2 tablets by mouth every 4 (four) hours as needed. For headache    Historical Provider, MD  naproxen sodium (ANAPROX) 220 MG tablet Take 440 mg by mouth 2 (two) times daily as needed. For pain    Historical Provider, MD  sulfamethoxazole-trimethoprim (SEPTRA DS) 800-160 MG per tablet Take 1 tablet by mouth every 12 (twelve) hours. 10/29/12   Peter Dammen, PA-C   BP 141/86 mmHg  Pulse 74  Temp(Src) 98.1 F (36.7 C) (Oral)  Resp 18  Ht  (1.651 m)  Wt 273 lb 4 oz (123.945 kg)  BMI 45.47 kg/m2  SpO2 97% Physical Exam  Constitutional: She is oriented to person, place, and time. She appears well-developed and well-nourished. No distress.  HENT:  Head: Normocephalic.  Eyes: Conjunctivae are normal. Pupils are equal, round, and reactive to light. No scleral icterus.  Neck: Normal range of motion. Neck supple. No thyromegaly present.  Cardiovascular: Normal rate and regular rhythm.  Exam reveals no gallop and no friction rub.   No murmur heard. Pulmonary/Chest: Effort normal and breath sounds normal. No respiratory distress. She has no wheezes. She has no rales.  Abdominal: Soft. Bowel sounds are normal. She exhibits no distension. There is no tenderness. There is no rebound.  Musculoskeletal: Normal range of motion.  Neurological: She is alert and oriented to person, place, and time.  Skin: Skin is warm and dry. No rash noted.  Area of reddish purple discoloration of the dorsum left hand. It would appear to be involving ecchymosis. Nontender. Full range of motion. X-rays Noted.  Not tender in the area of concern. Likely nutrient foramen.  Psychiatric: She has a normal mood and affect. Her behavior is normal.    ED Course  Procedures (including critical care time) Labs Review Labs Reviewed - No data to display  Imaging Review Dg Hand Complete Left  03/29/2016  CLINICAL DATA:  Initial evaluation for acute pain, bruising, swelling. EXAM: LEFT HAND - COMPLETE 3+ VIEW  COMPARISON:  None. FINDINGS: Question thin linear lucency at the base of the left fourth middle phalanx, seen on lateral projection. No significant overlying soft tissue swelling. No other acute fracture or dislocation. Joint spaces well maintained without evidence for significant degenerative or erosive arthropathy. Osseous mineralization normal. No significant soft tissue swelling or injury identified. No radiopaque foreign body. IMPRESSION: 1. Apparent linear lucency through the base of the left fourth middle phalanx as above. While a small nutrient foramen is favored, an acute nondisplaced fracture could also have this appearance. Correlation with site of pain recommended. 2. No other acute osseous abnormality about the left hand. Electronically Signed   By: Rise MuBenjamin  McClintock M.D.   On: 03/29/2016 00:08   I have personally reviewed and evaluated these images and lab results as part of my medical decision-making.   EKG Interpretation None      MDM   Final diagnoses:  Contusion  Phlebitis    Discussed with the patient this is very likely simple contusion. The possibility of a superficial phlebitis discussed as well. Plan is heat. Daily aspirin. Primary care follow-up.    Rolland PorterMark Iker Nuttall, MD 03/29/16 [phone]

## 2016-03-29 NOTE — Discharge Instructions (Signed)
The area on your hand may represent a simple bruise/contusion. It may represent a small area of vein irritation called phlebitis. Intermittently apply warmth to the area.  Phlebitis Phlebitis is soreness and swelling (inflammation) of a vein. This can occur in your arms, legs, or torso (trunk), as well as deeper inside your body. Phlebitis is usually not serious when it occurs close to the surface of the body. However, it can cause serious problems when it occurs in a vein deeper inside the body. CAUSES  Phlebitis can be triggered by various things, including:   Reduced blood flow through your veins. This can happen with:  Bed rest over a long period.  Long-distance travel.  Injury.  Surgery.  Being overweight (obese) or pregnant.  Having an IV tube put in the vein and getting certain medicines through the vein.  Cancer and cancer treatment.  Use of illegal drugs taken through the vein.  Inflammatory diseases.  Inherited (genetic) diseases that increase the risk of blood clots.  Hormone therapy, such as birth control pills. SIGNS AND SYMPTOMS   Red, tender, swollen, and painful area on your skin. Usually, the area will be long and narrow.  Firmness along the center of the affected area. This can indicate that a blood clot has formed.  Low-grade fever. DIAGNOSIS  A health care provider can usually diagnose phlebitis by examining the affected area and asking about your symptoms. To check for infection or blood clots, your health care provider may order blood tests or an ultrasound exam of the area. Blood tests and your family history may also indicate if you have an underlying genetic disease that causes blood clots. Occasionally, a piece of tissue is taken from the body (biopsy sample) if an unusual cause of phlebitis is suspected. TREATMENT  Treatment will vary depending on the severity of the condition and the area of the body affected. Treatment may include:  Use of a  warm compress or heating pad.  Use of compression stockings or bandages.  Anti-inflammatory medicines.  Removal of any IV tube that may be causing the problem.  Medicines that kill germs (antibiotics) if an infection is present.  Blood-thinning medicines if a blood clot is suspected or present.  In rare cases, surgery may be needed to remove damaged sections of vein. HOME CARE INSTRUCTIONS   Only take over-the-counter or prescription medicines as directed by your health care provider. Take all medicines exactly as prescribed.  Raise (elevate) the affected area above the level of your heart as directed by your health care provider.  Apply a warm compress or heating pad to the affected area as directed by your health care provider. Do not sleep with the heating pad.  Use compression stockings or bandages as directed. These will speed healing and prevent the condition from coming back.  If you are on blood thinners:  Get follow-up blood tests as directed by your health care provider.  Check with your health care provider before using any new medicines.  Carry a medical alert card or wear your medical alert jewelry to show that you are on blood thinners.  For phlebitis in the legs:  Avoid prolonged standing or bed rest.  Keep your legs moving. Raise your legs when sitting or lying.  Do not smoke.  Women, particularly those over the age of 81, should consider the risks and benefits of taking the contraceptive pill. This kind of hormone treatment can increase your risk for blood clots.  Follow up with your  health care provider as directed. SEEK MEDICAL CARE IF:   You have unusual bruising or any bleeding problems.  Your swelling or pain in the affected area is not improving.  You are on anti-inflammatory medicine, and you develop belly (abdominal) pain. SEEK IMMEDIATE MEDICAL CARE IF:   You have a sudden onset of chest pain or difficulty breathing.  You have a fever or  persistent symptoms for more than 2-3 days.  You have a fever and your symptoms suddenly get worse. MAKE SURE YOU:  Understand these instructions.  Will watch your condition.  Will get help right away if you are not doing well or get worse.   This information is not intended to replace advice given to you by your health care provider. Make sure you discuss any questions you have with your health care provider.   Document Released: 12/05/2001 Document Revised: 10/01/2013 Document Reviewed: 08/18/2013 Elsevier Interactive Patient Education [employer]2016 [employer] Inc[employer]

## 2016-12-11 IMAGING — DX DG HAND COMPLETE 3+V*L*
3 series · 3 of 3 positions shown · non-contrast
Comparison: None.

CLINICAL DATA: Initial evaluation for acute pain, bruising,
swelling.

EXAM:
LEFT HAND - COMPLETE 3+ VIEW

[hand pa]
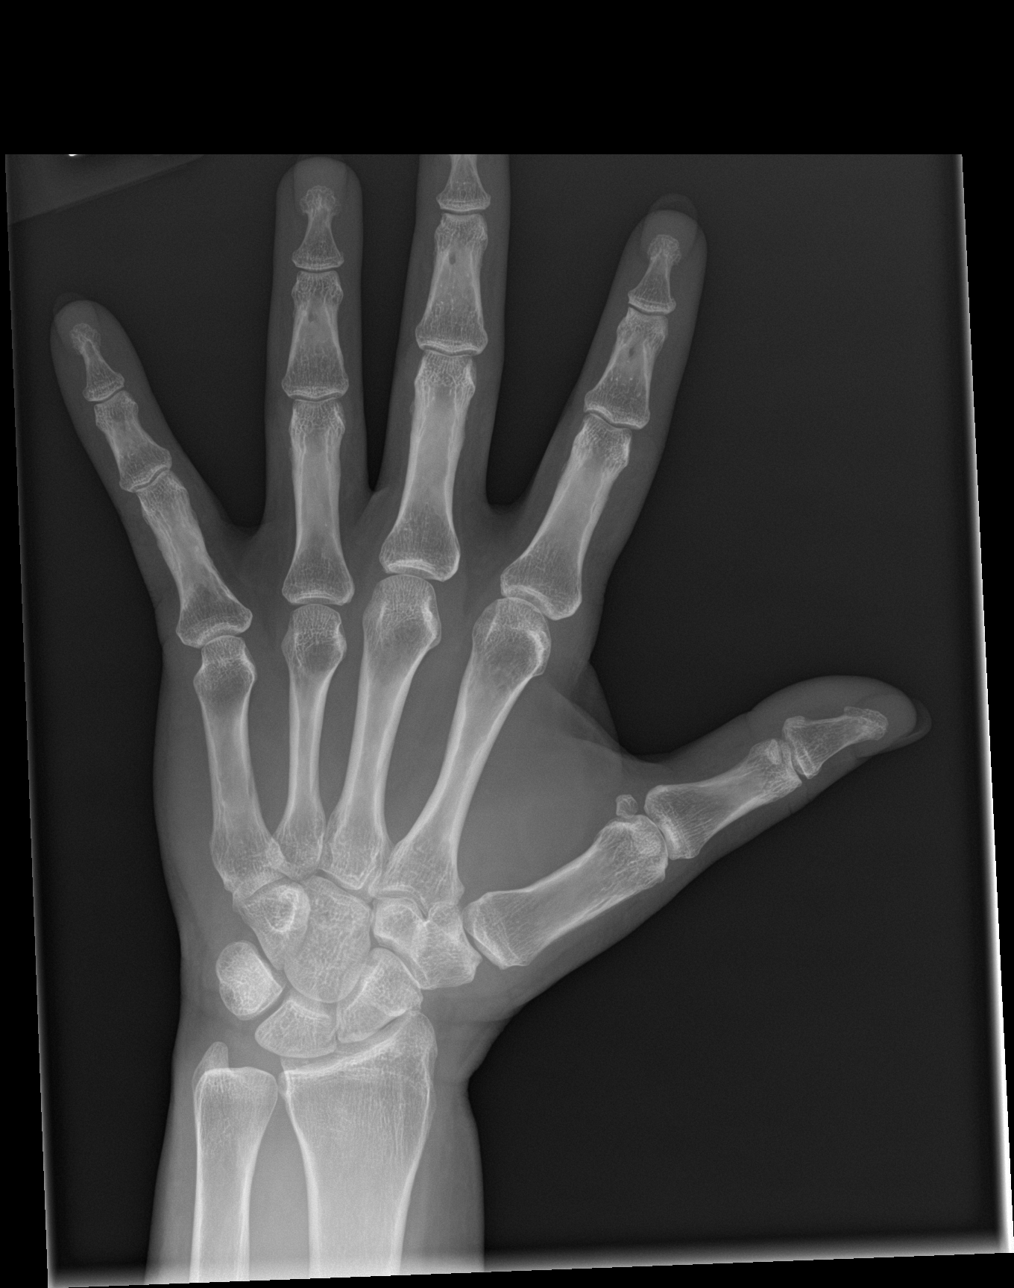

[hand obl]
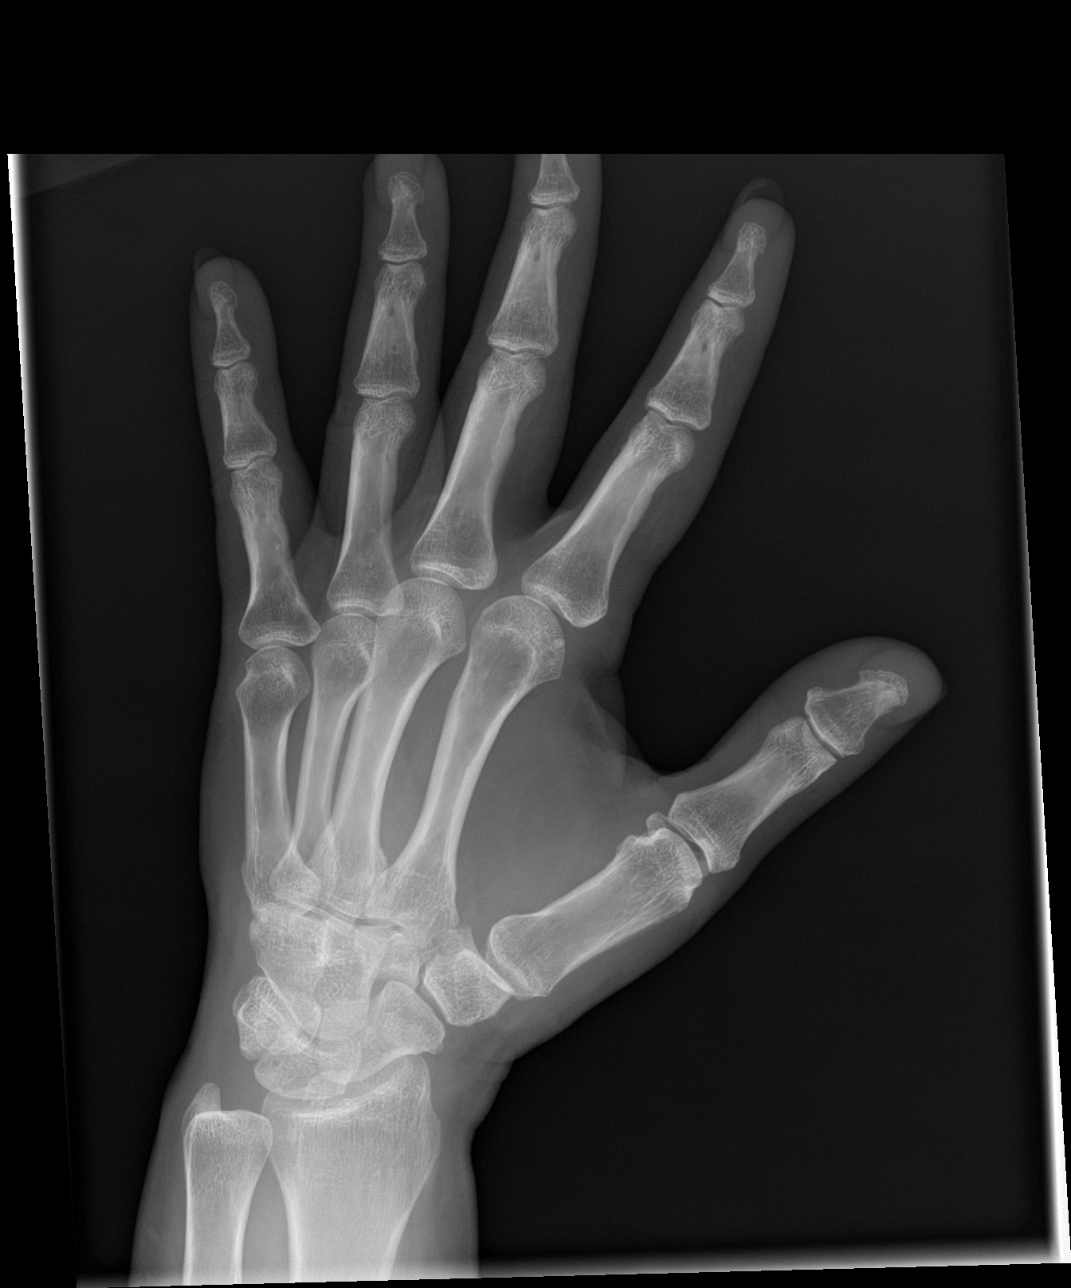

[hand lat]
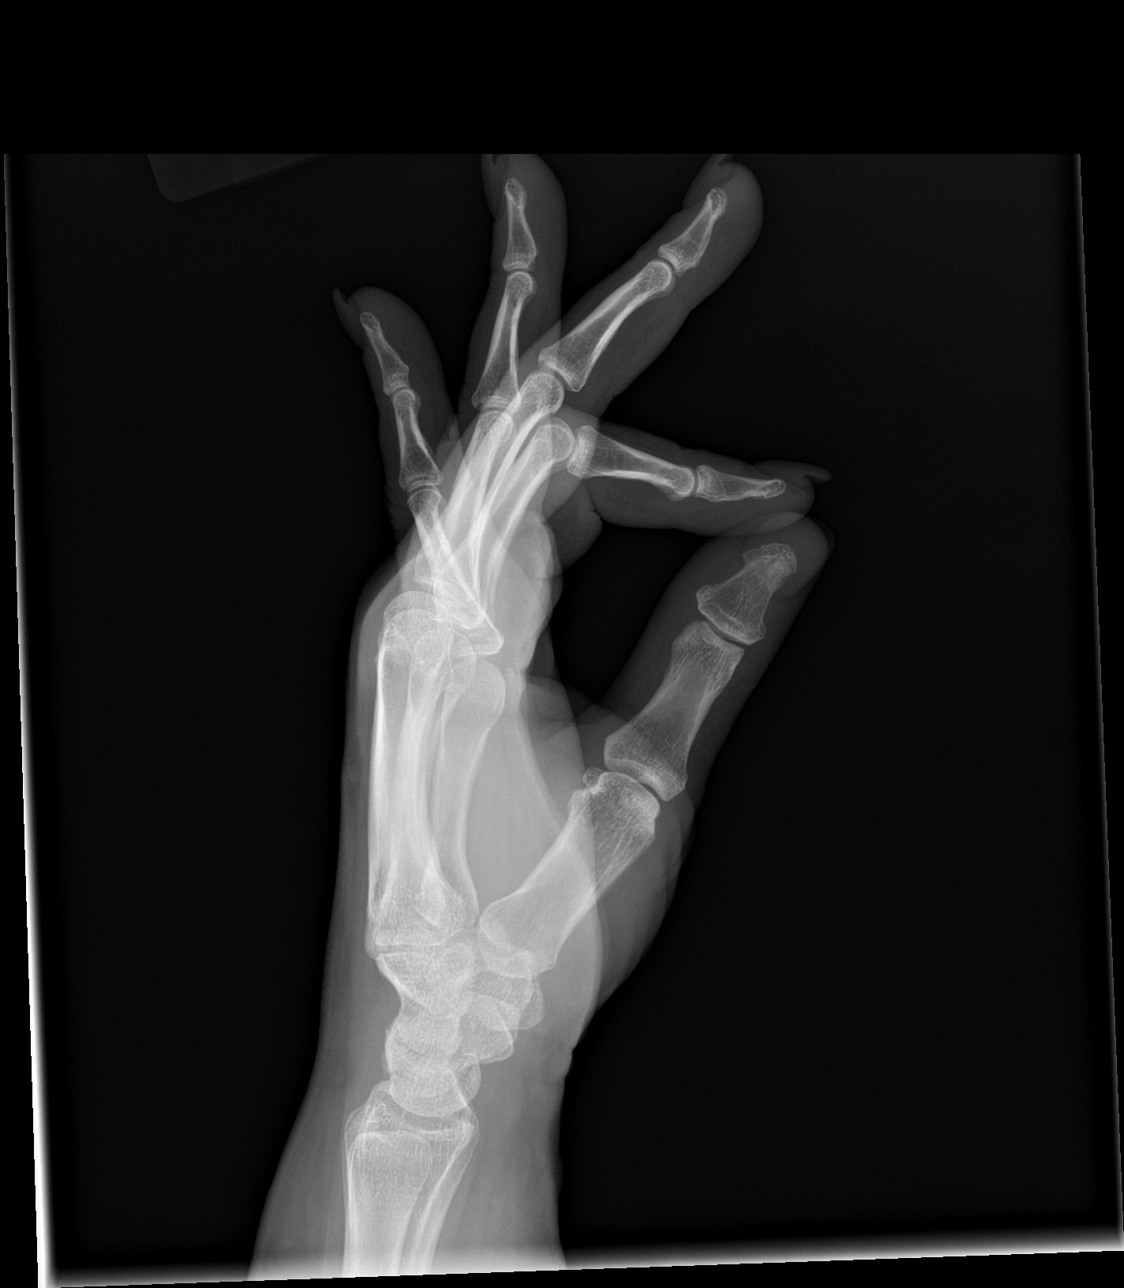

[3 of 3 positions shown; findings below may reference images not displayed]

FINDINGS: Question thin linear lucency at the base of the left fourth middle
phalanx, seen on lateral projection. No significant overlying soft
tissue swelling. No other acute fracture or dislocation. Joint
spaces well maintained without evidence for significant degenerative
or erosive arthropathy. Osseous mineralization normal. No
significant soft tissue swelling or injury identified. No radiopaque
foreign body.
IMPRESSION: 1. Apparent linear lucency through the base of the left fourth
middle phalanx as above. While a small nutrient foramen is favored,
an acute nondisplaced fracture could also have this appearance.
Correlation with site of pain recommended.
2. No other acute osseous abnormality about the left hand.

## 2021-11-10 ENCOUNTER — Other Ambulatory Visit: Payer: Self-pay

## 2021-11-10 ENCOUNTER — Encounter (HOSPITAL_COMMUNITY): Payer: Self-pay | Admitting: Emergency Medicine

## 2021-11-10 ENCOUNTER — Ambulatory Visit (HOSPITAL_COMMUNITY)
Admission: EM | Admit: 2021-11-10 | Discharge: 2021-11-10 | Disposition: A | Discharge status: Home / Self Care | Payer: BC Managed Care – PPO | Attending: Urgent Care | Admitting: Urgent Care

## 2021-11-10 DIAGNOSIS — M79652 Pain in left thigh: Secondary | ICD-10-CM

## 2021-11-10 MED ORDER — GABAPENTIN 100 MG PO CAPS: 100.0000 mg | ORAL_CAPSULE | Freq: Three times a day (TID) | ORAL | 0 refills | Status: DC

## 2021-11-10 MED ORDER — KETOROLAC TROMETHAMINE 30 MG/ML IJ SOLN
INTRAMUSCULAR | Status: AC
  Filled 2021-11-10: qty 1

## 2021-11-10 MED ORDER — VALACYCLOVIR HCL 1 G PO TABS: 1000.0000 mg | ORAL_TABLET | Freq: Three times a day (TID) | ORAL | 0 refills | Status: AC

## 2021-11-10 MED ORDER — TIZANIDINE HCL 4 MG PO TABS: 4.0000 mg | ORAL_TABLET | Freq: Every day | ORAL | 0 refills | Status: AC

## 2021-11-10 MED ORDER — NAPROXEN 500 MG PO TABS: 500.0000 mg | ORAL_TABLET | Freq: Two times a day (BID) | ORAL | 0 refills | Status: AC

## 2021-11-10 MED ORDER — KETOROLAC TROMETHAMINE 30 MG/ML IJ SOLN
30.0000 mg | Freq: Once | INTRAMUSCULAR | Status: AC
  Administered 2021-11-10: 18:00:00 30 mg via INTRAMUSCULAR

## 2021-11-10 NOTE — ED Triage Notes (Signed)
Pt is present today with left leg pain. Pt states that pain is starting at the top of her thigh and radiating down her leg Pt pain started this morning

## 2021-11-10 NOTE — Discharge Instructions (Addendum)
Start taking naproxen for pain and inflammation. Gabapentin can be used for nerve type pain as well. Use tizanidine as a muscle relaxant. If you develop a painful rash, then please start valacyclovir as an antiviral against Shingles.

## 2021-11-10 NOTE — ED Provider Notes (Signed)
[facility]   MRN: [id number] DOB: 1966/07/23  Subjective:   Donna [name] is a 55 y.o. female presenting for acute onset this morning of severe 10 out of 10 left-sided leg pain that starts laterally and radiates across the front of her thigh to the medial thigh and sometimes down toward her knee.  Has had associated tingling sensations.  Felt spasms earlier today of the thigh muscle.  Tried Voltaren and Robaxin without any relief.  No fall, trauma, redness, rashes.  No history of musculoskeletal disorders.  Patient does describe that she hurt her thigh 2 weeks ago when she was trying to squeeze between the tie placed to get to her car.  However though symptoms resolved.  No current facility-administered medications for this encounter.  Current Outpatient Medications:    Aspirin-Acetaminophen-Caffeine (EXCEDRIN PO), Take 2 tablets by mouth every 4 (four) hours as needed. For headache, Disp: , Rfl:    Allergies  Allergen Reactions   Amoxicillin Nausea And Vomiting    Can tolerate low doses   Codeine Nausea And Vomiting    Can tolerate low doses    Past Medical History:  Diagnosis Date   Hypertension      Past Surgical History:  Procedure Laterality Date   ABDOMINAL HYSTERECTOMY     partial   CESAREAN SECTION     CHOLECYSTECTOMY     KNEE ARTHROSCOPY     TONSILLECTOMY      History reviewed. No pertinent family history.  Social History   Tobacco Use   Smoking status: Never  Substance Use Topics   Alcohol use: No   Drug use: No    ROS   Objective:   Vitals: BP (!) 142/94   Pulse 88   Temp 98.6 F (37 C)   Resp 19   SpO2 98%   Physical Exam Constitutional:      General: She is not in acute distress.    Appearance: Normal appearance. She is well-developed. She is not ill-appearing, toxic-appearing or diaphoretic.  HENT:     Head: Normocephalic and atraumatic.     Nose: Nose normal.     Mouth/Throat:     Mouth: Mucous membranes are  moist.     Pharynx: Oropharynx is clear.  Eyes:     General: No scleral icterus.    Extraocular Movements: Extraocular movements intact.     Pupils: Pupils are equal, round, and reactive to light.  Cardiovascular:     Rate and Rhythm: Normal rate.  Pulmonary:     Effort: Pulmonary effort is normal.  Musculoskeletal:     Left upper leg: Tenderness (over area outlined, superficial) present. No swelling, edema, deformity, lacerations or bony tenderness.     Left knee: No swelling, deformity, effusion, erythema, ecchymosis, lacerations, bony tenderness or crepitus. Normal range of motion. No tenderness. Normal alignment and normal patellar mobility.       Legs:  Skin:    General: Skin is warm and dry.     Findings: No rash.  Neurological:     General: No focal deficit present.     Mental Status: She is alert and oriented to person, place, and time.     Motor: No weakness.     Coordination: Coordination abnormal (favoring left thigh).  Psychiatric:        Mood and Affect: Mood normal.        Behavior: Behavior normal.        Thought Content: Thought content normal.  Judgment: Judgment normal.    Assessment and Plan :   PDMP not reviewed this encounter.  1. Acute pain of left thigh    I offered patient an x-ray but I believe this would be low yield and she declined.  Recommended managing for musculoskeletal type pain as there is no sign of cellulitis, abscess.  She does not have a rash but I suspect that the distribution and neuropathic type symptoms will end up developing a shingles rash.  IM Toradol in clinic, naproxen and tizanidine at home.  Gabapentin for the neuropathic pain.  If she develops rash, start Valtrex.  Provide her with a note for work. Counseled patient on potential for adverse effects with medications prescribed/recommended today, ER and return-to-clinic precautions discussed, patient verbalized understanding.    Wallis Bamberg, PA-C 11/10/21 1818

## 2023-07-26 ENCOUNTER — Other Ambulatory Visit: Payer: Self-pay | Admitting: Gastroenterology

## 2023-09-03 ENCOUNTER — Encounter (HOSPITAL_COMMUNITY): Payer: Self-pay | Admitting: Gastroenterology

## 2023-09-03 ENCOUNTER — Other Ambulatory Visit: Payer: Self-pay

## 2023-09-10 NOTE — Anesthesia Preprocedure Evaluation (Addendum)
Anesthesia Evaluation  Patient identified by MRN, date of birth, ID band Patient awake    Reviewed: Allergy & Precautions, NPO status , Patient's Chart, lab work & pertinent test results  Airway Mallampati: II  TM Distance: >3 FB Neck ROM: Full    Dental no notable dental hx. (+) Teeth Intact, Dental Advisory Given   Pulmonary neg pulmonary ROS   Pulmonary exam normal breath sounds clear to auscultation       Cardiovascular hypertension, Normal cardiovascular exam Rhythm:Regular Rate:Normal     Neuro/Psych negative neurological ROS  negative psych ROS   GI/Hepatic negative GI ROS, Neg liver ROS,,,  Endo/Other    Morbid obesity (bmi 45)  Renal/GU negative Renal ROS  negative genitourinary   Musculoskeletal  (+) Arthritis ,    Abdominal   Peds  Hematology negative hematology ROS (+)   Anesthesia Other Findings   Reproductive/Obstetrics                             Anesthesia Physical Anesthesia Plan  ASA: 3  Anesthesia Plan: MAC   Post-op Pain Management:    Induction: Intravenous  PONV Risk Score and Plan: Propofol infusion and Treatment may vary due to age or medical condition  Airway Management Planned: Natural Airway  Additional Equipment:   Intra-op Plan:   Post-operative Plan:   Informed Consent: I have reviewed the patients History and Physical, chart, labs and discussed the procedure including the risks, benefits and alternatives for the proposed anesthesia with the patient or authorized representative who has indicated his/her understanding and acceptance.     Dental advisory given  Plan Discussed with: CRNA  Anesthesia Plan Comments:        Anesthesia Quick Evaluation

## 2023-09-11 ENCOUNTER — Ambulatory Visit (HOSPITAL_COMMUNITY): Payer: Self-pay | Admitting: Anesthesiology

## 2023-09-11 ENCOUNTER — Ambulatory Visit (HOSPITAL_BASED_OUTPATIENT_CLINIC_OR_DEPARTMENT_OTHER): Payer: Self-pay | Admitting: Anesthesiology

## 2023-09-11 ENCOUNTER — Encounter (HOSPITAL_COMMUNITY)
Admission: RE | Disposition: A | Discharge status: Home / Self Care | Payer: Self-pay | Source: Home / Self Care | Attending: Gastroenterology

## 2023-09-11 ENCOUNTER — Encounter (HOSPITAL_COMMUNITY): Payer: Self-pay | Admitting: Gastroenterology

## 2023-09-11 ENCOUNTER — Other Ambulatory Visit: Payer: Self-pay

## 2023-09-11 ENCOUNTER — Ambulatory Visit (HOSPITAL_COMMUNITY)
Admission: RE | Admit: 2023-09-11 | Discharge: 2023-09-11 | Disposition: A | Discharge status: Home / Self Care | Payer: No Typology Code available for payment source | Attending: Gastroenterology | Admitting: Gastroenterology

## 2023-09-11 DIAGNOSIS — K648 Other hemorrhoids: Secondary | ICD-10-CM | POA: Diagnosis not present

## 2023-09-11 DIAGNOSIS — R195 Other fecal abnormalities: Secondary | ICD-10-CM | POA: Diagnosis not present

## 2023-09-11 DIAGNOSIS — I10 Essential (primary) hypertension: Secondary | ICD-10-CM | POA: Diagnosis not present

## 2023-09-11 DIAGNOSIS — Z1211 Encounter for screening for malignant neoplasm of colon: Secondary | ICD-10-CM | POA: Diagnosis present

## 2023-09-11 DIAGNOSIS — Z83719 Family history of colon polyps, unspecified: Secondary | ICD-10-CM | POA: Insufficient documentation

## 2023-09-11 DIAGNOSIS — Z6841 Body Mass Index (BMI) 40.0 and over, adult: Secondary | ICD-10-CM | POA: Diagnosis not present

## 2023-09-11 HISTORY — PX: COLONOSCOPY WITH PROPOFOL: SHX5780

## 2023-09-11 HISTORY — DX: Prediabetes: R73.03

## 2023-09-11 HISTORY — DX: Unspecified osteoarthritis, unspecified site: M19.90

## 2023-09-11 LAB — GLUCOSE, CAPILLARY: Glucose-Capillary: 130 mg/dL — ABNORMAL HIGH (ref 70–99)

## 2023-09-11 SURGERY — COLONOSCOPY WITH PROPOFOL
Anesthesia: Monitor Anesthesia Care | Laterality: Bilateral

## 2023-09-11 MED ORDER — LACTATED RINGERS IV SOLN
INTRAVENOUS | Status: AC | PRN
Start: 2023-09-11 — End: 2023-09-11
  Administered 2023-09-11: 1000 mL via INTRAVENOUS

## 2023-09-11 MED ORDER — PROPOFOL 500 MG/50ML IV EMUL
INTRAVENOUS | Status: AC
  Filled 2023-09-11: qty 50

## 2023-09-11 MED ORDER — ONDANSETRON HCL 4 MG/2ML IJ SOLN
INTRAMUSCULAR | Status: DC | PRN
  Administered 2023-09-11: 4 mg via INTRAVENOUS

## 2023-09-11 MED ORDER — PROPOFOL 500 MG/50ML IV EMUL
INTRAVENOUS | Status: DC | PRN
  Administered 2023-09-11: 125 ug/kg/min via INTRAVENOUS

## 2023-09-11 MED ORDER — PROPOFOL 1000 MG/100ML IV EMUL
INTRAVENOUS | Status: AC
  Filled 2023-09-11: qty 100

## 2023-09-11 MED ORDER — SODIUM CHLORIDE 0.9 % IV SOLN: INTRAVENOUS | Status: DC

## 2023-09-11 MED ORDER — PROPOFOL 10 MG/ML IV BOLUS
INTRAVENOUS | Status: DC | PRN
  Administered 2023-09-11: 40 mg via INTRAVENOUS
  Administered 2023-09-11: 10 mg via INTRAVENOUS
  Administered 2023-09-11: 20 mg via INTRAVENOUS

## 2023-09-11 SURGICAL SUPPLY — 22 items

## 2023-09-11 NOTE — H&P (Signed)
[facility] H/P Note  Chief Complaint: cologuard positive  HPI: Donna [name] is an 57 y.o. female.  Here for colonoscopy after positive cologuard test.  Mother history of polyps.  Patient has no abdominal pain, change in bowel habits, blood in stool.  No prior colonoscopy.  Past Medical History:  Diagnosis Date   Arthritis    Hypertension    Pre-diabetes     Past Surgical History:  Procedure Laterality Date   ABDOMINAL HYSTERECTOMY     partial   CESAREAN SECTION     CHOLECYSTECTOMY     KNEE ARTHROSCOPY Bilateral    TONSILLECTOMY      Medications Prior to Admission  Medication Sig Dispense Refill   Aspirin-Acetaminophen-Caffeine (EXCEDRIN PO) Take 2 tablets by mouth every 4 (four) hours as needed. For headache     cyclobenzaprine (FLEXERIL) 5 MG tablet Take 5 mg by mouth 3 (three) times daily as needed for muscle spasms.     hydrochlorothiazide (HYDRODIURIL) 12.5 MG tablet Take 12.5 mg by mouth daily.     lisinopril (ZESTRIL) 10 MG tablet Take 10 mg by mouth daily.     magnesium oxide (MAG-OX) 400 (240 Mg) MG tablet Take 400 mg by mouth 2 (two) times daily.     metformin (FORTAMET) 500 MG (OSM) 24 hr tablet Take 500 mg by mouth at bedtime.     naproxen (NAPROSYN) 500 MG tablet Take 1 tablet (500 mg total) by mouth 2 (two) times daily with a meal. 30 tablet 0   gabapentin (NEURONTIN) 100 MG capsule Take 1 capsule (100 mg total) by mouth 3 (three) times daily. (Patient not taking: Reported on 09/03/2023) 30 capsule 0   tiZANidine (ZANAFLEX) 4 MG tablet Take 1 tablet (4 mg total) by mouth at bedtime. 30 tablet 0    Allergies:  Allergies  Allergen Reactions   Amoxicillin Nausea And Vomiting    Can tolerate low doses   Codeine Nausea And Vomiting    Can tolerate low doses    History reviewed. No pertinent family history.  Social History:  reports that she has never smoked. She does not have any smokeless tobacco history on file. She reports current alcohol use. She  reports that she does not use drugs.   ROS: As per HPI, all others negative   Blood pressure (!) 155/74, pulse 84, temperature 97.8 F (36.6 C), temperature source Tympanic, resp. rate 10, height 5\' 5"  (1.651 m), weight 125 kg, SpO2 97%. General appearance: Overweight, NAD HEENT:  [place]/AT, anicteric NEURO:  No encephalopathy CV:  Regular LUNGS:  No distress ABD:  Soft, protuberant, non-tender  Results for orders placed or performed during the hospital encounter of 09/11/23 (from the past 48 hour(s))  Glucose, capillary     Status: Abnormal   Collection Time: 09/11/23  8:02 AM  Result Value Ref Range   Glucose-Capillary 130 (H) 70 - 99 mg/dL    Comment: Glucose reference range applies only to samples taken after fasting for at least 8 hours.   No results found.  Assessment/Plan   Cologuard positive. Family history of colonic polyps (mother). Colonoscopy. Risks (bleeding, infection, bowel perforation that could require surgery, sedation-related changes in cardiopulmonary systems), benefits (identification and possible treatment of source of symptoms, exclusion of certain causes of symptoms), and alternatives (watchful waiting, radiographic imaging studies, empiric medical treatment) of colonoscopy were explained to patient/family in detail and patient wishes to proceed.   Freddy Jaksch 09/11/2023, 8:35 AM

## 2023-09-11 NOTE — Transfer of Care (Signed)
Immediate Anesthesia Transfer of Care Note  Patient: Donna [name]  Procedure(s) Performed: COLONOSCOPY WITH PROPOFOL (Bilateral)  Patient Location: PACU  Anesthesia Type:MAC  Level of Consciousness: drowsy  Airway & Oxygen Therapy: Patient Spontanous Breathing and Patient connected to face mask oxygen  Post-op Assessment: Report given to RN and Post -op Vital signs reviewed and stable  Post vital signs: Reviewed and stable  Last Vitals:  Vitals Value Taken Time  BP    Temp    Pulse    Resp    SpO2      Last Pain:  Vitals:   09/11/23 0747  TempSrc: Tympanic  PainSc: 0-No pain         Complications: No notable events documented.

## 2023-09-11 NOTE — Anesthesia Postprocedure Evaluation (Signed)
Anesthesia Post Note  Patient: Donna [name]  Procedure(s) Performed: COLONOSCOPY WITH PROPOFOL (Bilateral)     Patient location during evaluation: Endoscopy Anesthesia Type: MAC Level of consciousness: awake and alert Pain management: pain level controlled Vital Signs Assessment: post-procedure vital signs reviewed and stable Respiratory status: spontaneous breathing, nonlabored ventilation, respiratory function stable and patient connected to nasal cannula oxygen Cardiovascular status: blood pressure returned to baseline and stable Postop Assessment: no apparent nausea or vomiting Anesthetic complications: no  No notable events documented.  Last Vitals:  Vitals:   09/11/23 0930 09/11/23 0941  BP: (!) 149/87 (!) 151/86  Pulse: 83 82  Resp: 12 15  Temp:    SpO2: 97% 97%    Last Pain:  Vitals:   09/11/23 0941  TempSrc:   PainSc: 0-No pain                 Alene Bergerson L Dustine Stickler

## 2023-09-11 NOTE — Op Note (Signed)
[facility] Patient Name: Donna [name] Procedure Date: 09/11/2023 MRN: [id number] Attending MD: Willis Modena , MD, [id number] Date of Birth: 01/22/1966 CSN: [id number] Age: 57 Admit Type: Outpatient Procedure:                Colonoscopy Indications:              This is the patient's first colonoscopy, Positive                            Cologuard test; family history of colonic polyps                            (mother) Providers:                Willis Modena, MD, Marge Duncans, RN, Beryle Beams, Technician, Maricela Curet, CRNA Referring MD:              Medicines:                Monitored Anesthesia Care Complications:            No immediate complications. Estimated Blood Loss:     Estimated blood loss: none. Procedure:                Pre-Anesthesia Assessment:                           - Prior to the procedure, a History and Physical                            was performed, and patient medications and                            allergies were reviewed. The patient's tolerance of                            previous anesthesia was also reviewed. The risks                            and benefits of the procedure and the sedation                            options and risks were discussed with the patient.                            All questions were answered, and informed consent                            was obtained. Prior Anticoagulants: The patient has                            taken no anticoagulant or antiplatelet agents. ASA                            Grade  Assessment: III - A patient with severe                            systemic disease. After reviewing the risks and                            benefits, the patient was deemed in satisfactory                            condition to undergo the procedure.                           After obtaining informed consent, the colonoscope                            was passed under  direct vision. Throughout the                            procedure, the patient's blood pressure, pulse, and                            oxygen saturations were monitored continuously. The                            CF-HQ190L ([id number] Olympus colonoscope was                            introduced through the anus and advanced to the the                            cecum, identified by appendiceal orifice and                            ileocecal valve. The ileocecal valve, appendiceal                            orifice, and rectum were photographed. The                            colonoscopy was performed without difficulty. The                            patient tolerated the procedure well. The quality                            of the bowel preparation was good. Scope In: 8:53:31 AM Scope Out: 9:10:02 AM Scope Withdrawal Time: 0 hours 14 minutes 19 seconds  Total Procedure Duration: 0 hours 16 minutes 31 seconds  Findings:      Hemorrhoids were found on perianal exam.      Internal hemorrhoids were found during retroflexion. The hemorrhoids       were mild.      Colon otherwise normal; no other polyps, masses, vascular ectasias, or       inflammatory changes were seen. Impression:               -  Hemorrhoids found on perianal exam. Suspect                            source Cologuard positive stool.                           - Internal hemorrhoids.                           - No specimens collected.                           - The examination was otherwise normal. Moderate Sedation:      None Recommendation:           - Patient has a contact number available for                            emergencies. The signs and symptoms of potential                            delayed complications were discussed with the                            patient. Return to normal activities tomorrow.                            Written discharge instructions were provided to the                             patient.                           - Discharge patient to home (via wheelchair).                           - High fiber diet indefinitely.                           - Continue present medications.                           - Repeat colonoscopy in 5 years for screening                            purposes.                           - Return to GI clinic PRN.                           - Return to referring physician as previously                            scheduled. Procedure Code(s):        --- Professional ---  21308, Colonoscopy, flexible; diagnostic, including                            collection of specimen(s) by brushing or washing,                            when performed (separate procedure) Diagnosis Code(s):        --- Professional ---                           K64.8, Other hemorrhoids                           R19.5, Other fecal abnormalities CPT copyright 2022 [employer]. All rights reserved. The codes documented in this report are preliminary and upon coder review may  be revised to meet current compliance requirements. Willis Modena, MD 09/11/2023 9:16:16 AM This report has been signed electronically. Number of Addenda: 0

## 2023-09-11 NOTE — Discharge Instructions (Signed)
YOU HAD AN ENDOSCOPIC PROCEDURE TODAY: Refer to the procedure report and other information in the discharge instructions given to you for any specific questions about what was found during the examination. If this information does not answer your questions, please call [facility] office at [phone] to clarify.   YOU SHOULD EXPECT: Some feelings of bloating in the abdomen. Passage of more gas than usual. Walking can help get rid of the air that was put into your GI tract during the procedure and reduce the bloating. If you had a lower endoscopy (such as a colonoscopy or flexible sigmoidoscopy) you may notice spotting of blood in your stool or on the toilet paper. Some abdominal soreness may be present for a day or two, also.  DIET: Your first meal following the procedure should be a light meal and then it is ok to progress to your normal diet. A half-sandwich or bowl of soup is an example of a good first meal. Heavy or fried foods are harder to digest and may make you feel nauseous or bloated. Drink plenty of fluids but you should avoid alcoholic beverages for 24 hours. If you had a esophageal dilation, please see attached instructions for diet.    ACTIVITY: Your care partner should take you home directly after the procedure. You should plan to take it easy, moving slowly for the rest of the day. You can resume normal activity the day after the procedure however YOU SHOULD NOT DRIVE, use power tools, machinery or perform tasks that involve climbing or major physical exertion for 24 hours (because of the sedation medicines used during the test).   SYMPTOMS TO REPORT IMMEDIATELY: A gastroenterologist can be reached at any hour. Please call [phone]  for any of the following symptoms:  Following lower endoscopy (colonoscopy, flexible sigmoidoscopy) Excessive amounts of blood in the stool  Significant tenderness, worsening of abdominal pains  Swelling of the abdomen that is new, acute  Fever of 100  or higher  Following upper endoscopy (EGD, EUS, ERCP, esophageal dilation) Vomiting of blood or coffee ground material  New, significant abdominal pain  New, significant chest pain or pain under the shoulder blades  Painful or persistently difficult swallowing  New shortness of breath  Black, tarry-looking or red, bloody stools  FOLLOW UP:  If any biopsies were taken you will be contacted by phone or by letter within the next 1-3 weeks. Call [phone]  if you have not heard about the biopsies in 3 weeks.  Please also call with any specific questions about appointments or follow up tests. YOU HAD AN ENDOSCOPIC PROCEDURE TODAY: Refer to the procedure report and other information in the discharge instructions given to you for any specific questions about what was found during the examination. If this information does not answer your questions, please call [facility] office at [phone] to clarify. YOU HAD AN ENDOSCOPIC PROCEDURE TODAY: Refer to the procedure report and other information in the discharge instructions given to you for any specific questions about what was found during the examination. If this information does not answer your questions, please call [facility] office at [phone] to clarify.   YOU SHOULD EXPECT: Some feelings of bloating in the abdomen. Passage of more gas than usual. Walking can help get rid of the air that was put into your GI tract during the procedure and reduce the bloating. If you had a lower endoscopy (such as a colonoscopy or flexible sigmoidoscopy) you may notice spotting of blood in your stool  or on the toilet paper. Some abdominal soreness may be present for a day or two, also.  DIET: Your first meal following the procedure should be a light meal and then it is ok to progress to your normal diet. A half-sandwich or bowl of soup is an example of a good first meal. Heavy or fried foods are harder to digest and may make you feel nauseous or bloated. Drink plenty  of fluids but you should avoid alcoholic beverages for 24 hours. If you had a esophageal dilation, please see attached instructions for diet.    ACTIVITY: Your care partner should take you home directly after the procedure. You should plan to take it easy, moving slowly for the rest of the day. You can resume normal activity the day after the procedure however YOU SHOULD NOT DRIVE, use power tools, machinery or perform tasks that involve climbing or major physical exertion for 24 hours (because of the sedation medicines used during the test).   SYMPTOMS TO REPORT IMMEDIATELY: A gastroenterologist can be reached at any hour. Please call [phone]  for any of the following symptoms:  Following lower endoscopy (colonoscopy, flexible sigmoidoscopy) Excessive amounts of blood in the stool  Significant tenderness, worsening of abdominal pains  Swelling of the abdomen that is new, acute  Fever of 100 or higher  Following upper endoscopy (EGD, EUS, ERCP, esophageal dilation) Vomiting of blood or coffee ground material  New, significant abdominal pain  New, significant chest pain or pain under the shoulder blades  Painful or persistently difficult swallowing  New shortness of breath  Black, tarry-looking or red, bloody stools  FOLLOW UP:  If any biopsies were taken you will be contacted by phone or by letter within the next 1-3 weeks. Call [phone]  if you have not heard about the biopsies in 3 weeks.  Please also call with any specific questions about appointments or follow up tests. YOU HAD AN ENDOSCOPIC PROCEDURE TODAY: Refer to the procedure report and other information in the discharge instructions given to you for any specific questions about what was found during the examination. If this information does not answer your questions, please call [facility] office at [phone] to clarify.

## 2023-09-11 NOTE — Anesthesia Procedure Notes (Signed)
Procedure Name: MAC Date/Time: 09/11/2023 8:41 AM  Performed by: Orest Dikes, CRNAPre-anesthesia Checklist: Patient identified, Emergency Drugs available, Suction available and Patient being monitored Oxygen Delivery Method: Simple face mask

## 2023-09-14 ENCOUNTER — Encounter (HOSPITAL_COMMUNITY): Payer: Self-pay | Admitting: Gastroenterology

## 2024-02-20 ENCOUNTER — Other Ambulatory Visit: Payer: Self-pay | Admitting: Family Medicine

## 2024-02-20 DIAGNOSIS — Z1231 Encounter for screening mammogram for malignant neoplasm of breast: Secondary | ICD-10-CM

## 2024-02-26 ENCOUNTER — Ambulatory Visit
Admission: RE | Admit: 2024-02-26 | Discharge: 2024-02-26 | Disposition: A | Discharge status: Home / Self Care | Payer: BLUE CROSS/BLUE SHIELD | Source: Ambulatory Visit | Attending: Family Medicine | Admitting: Family Medicine

## 2024-02-26 DIAGNOSIS — Z1231 Encounter for screening mammogram for malignant neoplasm of breast: Secondary | ICD-10-CM

## 2025-01-09 ENCOUNTER — Other Ambulatory Visit (HOSPITAL_BASED_OUTPATIENT_CLINIC_OR_DEPARTMENT_OTHER): Payer: Self-pay | Admitting: Family Medicine

## 2025-01-09 DIAGNOSIS — E782 Mixed hyperlipidemia: Secondary | ICD-10-CM

## 2025-01-27 ENCOUNTER — Encounter (HOSPITAL_COMMUNITY): Payer: Self-pay

## 2025-01-27 ENCOUNTER — Other Ambulatory Visit (HOSPITAL_COMMUNITY)
